# Patient Record
Sex: Female | Born: 1945 | Race: White | Hispanic: No | Smoking: Never smoker
Health system: Southern US, Community
[De-identification: ages and names within clinical notes are randomized; demographics above are authoritative.]

## PROBLEM LIST (undated history)

## (undated) DIAGNOSIS — I4891 Unspecified atrial fibrillation: Secondary | ICD-10-CM

## (undated) DIAGNOSIS — I10 Essential (primary) hypertension: Secondary | ICD-10-CM

---

## 1999-10-26 ENCOUNTER — Other Ambulatory Visit: Admission: RE | Admit: 1999-10-26 | Discharge: 1999-10-26 | Payer: Self-pay | Admitting: Obstetrics and Gynecology

## 1999-10-26 ENCOUNTER — Encounter (INDEPENDENT_AMBULATORY_CARE_PROVIDER_SITE_OTHER): Payer: Self-pay

## 2001-02-25 ENCOUNTER — Encounter: Admission: RE | Admit: 2001-02-25 | Discharge: 2001-02-25 | Payer: Self-pay | Admitting: Family Medicine

## 2001-02-25 ENCOUNTER — Encounter: Payer: Self-pay | Admitting: Family Medicine

## 2001-02-28 ENCOUNTER — Ambulatory Visit (HOSPITAL_COMMUNITY): Admission: AD | Admit: 2001-02-28 | Discharge: 2001-02-28 | Payer: Self-pay | Admitting: Urology

## 2001-03-26 ENCOUNTER — Encounter: Admission: RE | Admit: 2001-03-26 | Discharge: 2001-03-26 | Payer: Self-pay | Admitting: Urology

## 2001-03-26 ENCOUNTER — Encounter: Payer: Self-pay | Admitting: Urology

## 2006-01-09 ENCOUNTER — Encounter (INDEPENDENT_AMBULATORY_CARE_PROVIDER_SITE_OTHER): Payer: Self-pay | Admitting: Specialist

## 2006-01-09 ENCOUNTER — Ambulatory Visit (HOSPITAL_COMMUNITY): Admission: RE | Admit: 2006-01-09 | Discharge: 2006-01-09 | Payer: Self-pay | Admitting: Surgery

## 2006-11-25 ENCOUNTER — Observation Stay (HOSPITAL_COMMUNITY): Admission: EM | Admit: 2006-11-25 | Discharge: 2006-11-26 | Payer: Self-pay | Admitting: Emergency Medicine

## 2006-11-25 ENCOUNTER — Ambulatory Visit: Payer: Self-pay | Admitting: Cardiology

## 2008-04-10 ENCOUNTER — Emergency Department (HOSPITAL_COMMUNITY): Admission: EM | Admit: 2008-04-10 | Discharge: 2008-04-10 | Payer: Self-pay | Admitting: Emergency Medicine

## 2010-09-13 ENCOUNTER — Emergency Department (HOSPITAL_COMMUNITY): Admission: EM | Admit: 2010-09-13 | Discharge: 2010-09-14 | Payer: Self-pay | Admitting: Emergency Medicine

## 2010-09-17 ENCOUNTER — Ambulatory Visit: Payer: Self-pay | Admitting: Internal Medicine

## 2010-09-17 ENCOUNTER — Observation Stay (HOSPITAL_COMMUNITY): Admission: EM | Admit: 2010-09-17 | Discharge: 2010-09-18 | Payer: Self-pay | Admitting: Emergency Medicine

## 2010-09-18 ENCOUNTER — Encounter: Payer: Self-pay | Admitting: Infectious Diseases

## 2011-01-02 LAB — URINALYSIS, ROUTINE W REFLEX MICROSCOPIC
Hgb urine dipstick: NEGATIVE
Ketones, ur: NEGATIVE mg/dL
Protein, ur: NEGATIVE mg/dL
Urobilinogen, UA: 0.2 mg/dL (ref 0.0–1.0)

## 2011-01-02 LAB — BASIC METABOLIC PANEL
BUN: 14 mg/dL (ref 6–23)
BUN: 16 mg/dL (ref 6–23)
CO2: 28 mEq/L (ref 19–32)
Calcium: 8.8 mg/dL (ref 8.4–10.5)
Calcium: 9.1 mg/dL (ref 8.4–10.5)
Chloride: 103 mEq/L (ref 96–112)
Creatinine, Ser: 1.02 mg/dL (ref 0.4–1.2)
GFR calc Af Amer: >60 mL/min (ref >60)
GFR calc non Af Amer: 51 mL/min — ABNORMAL LOW (ref >60)
Glucose, Bld: 106 mg/dL — ABNORMAL HIGH (ref 70–99)
Glucose, Bld: 125 mg/dL — ABNORMAL HIGH (ref 70–99)

## 2011-01-02 LAB — TROPONIN I: Troponin I: 0.03 ng/mL (ref 0.00–0.06)

## 2011-01-02 LAB — CBC
HCT: 40.2 % (ref 36.0–46.0)
Hemoglobin: 13.5 g/dL (ref 12.0–15.0)
MCH: 29.9 pg (ref 26.0–34.0)
MCH: 29.9 pg (ref 26.0–34.0)
MCHC: 33.5 g/dL (ref 30.0–36.0)
MCV: 88.9 fL (ref 78.0–100.0)
Platelets: 230 10*3/uL (ref 150–400)
RBC: 4.52 MIL/uL (ref 3.87–5.11)
RDW: 13.3 % (ref 11.5–15.5)
WBC: 7.9 10*3/uL (ref 4.0–10.5)

## 2011-01-02 LAB — HEMOGLOBIN A1C
Hgb A1c MFr Bld: 5.6 % (ref <5.7)
Mean Plasma Glucose: 114 mg/dL (ref <117)

## 2011-01-02 LAB — LIPID PANEL
HDL: 47 mg/dL (ref >39)
Total CHOL/HDL Ratio: 3.5 RATIO
Triglycerides: 99 mg/dL (ref <150)

## 2011-01-02 LAB — BRAIN NATRIURETIC PEPTIDE: Pro B Natriuretic peptide (BNP): 266 pg/mL — ABNORMAL HIGH (ref 0.0–100.0)

## 2011-01-02 LAB — POCT CARDIAC MARKERS
Myoglobin, poc: 52.2 ng/mL (ref 12–200)
Myoglobin, poc: 63.4 ng/mL (ref 12–200)
Troponin i, poc: <0.05 ng/mL (ref 0.00–0.09)

## 2011-01-02 LAB — CARDIAC PANEL(CRET KIN+CKTOT+MB+TROPI)
Relative Index: INVALID (ref 0.0–2.5)
Troponin I: 0.04 ng/mL (ref 0.00–0.06)

## 2011-01-02 LAB — URINE MICROSCOPIC-ADD ON

## 2011-01-02 LAB — CK TOTAL AND CKMB (NOT AT ARMC): Relative Index: INVALID (ref 0.0–2.5)

## 2011-03-09 NOTE — H&P (Signed)
NAME:  Sara Hale, Sara Hale                  ACCOUNT NO.:  [id number]   MEDICAL RECORD NO.:  [id number]          PATIENT TYPE:  EMS   LOCATION:  MAJO                         FACILITY:  [facility]   PHYSICIAN:  Joellyn Rued, PA-C     DATE OF BIRTH:  January 25, 1946   DATE OF ADMISSION:  11/25/2006  DATE OF DISCHARGE:                              HISTORY & PHYSICAL   This is a continuation of the previous dictation.   PHYSICAL EXAM:  EXTREMITIES:  Negative cyanosis, clubbing, or edema.  MUSCULOSKELETAL:  Unremarkable.  NEURO:  Unremarkable.   Chest x-ray and labs are pending at the time of this dictation.  EKGs at  [facility] and in the hospital show normal sinus rhythm, non-specific  ST, T wave changes, delayed R waves, baseline artifact normal, intervals  inactive.   IMPRESSION:  1. Prolonged atypical chest discomfort, probably gastrointestinal in      etiology.  2. Hypertension; however, she has not had her prescriptions today.   HISTORY PER PAST MEDICAL HISTORY DISPOSITION:  Dr. Jens Som reviewed the  patient's history, spoke with and examined the patient and agrees with  the above.  We will admit her for observation and cycle enzymes to rule  out myocardial infarction.  If her enzymes are negative, anticipate  discharge without patient stress Myoview and probable GI evaluation.  We  will obtain our usual chest x-ray and labs as well as amylase and lipase  and a D-dimer.  Protonix will be added to her medical regimen.      Joellyn Rued, PA-C     EW/MEDQ  D:  11/25/2006  T:  11/26/2006  Job:  [id number]   cc:   Madolyn Frieze. Jens Som, MD, [facility]  [facility]

## 2011-03-09 NOTE — Discharge Summary (Signed)
NAME:  Sara Hale, Sara Hale                  ACCOUNT NO.:  [id number]   MEDICAL RECORD NO.:  [id number]          PATIENT TYPE:  INP   LOCATION:  3735                         FACILITY:  [facility]   PHYSICIAN:  Madolyn Frieze. Jens Som, MD, FACCDATE OF BIRTH:  1946-06-30   DATE OF ADMISSION:  11/25/2006  DATE OF DISCHARGE:  11/26/2006                               DISCHARGE SUMMARY   DISCHARGE DIAGNOSES:  1. Atypical epigastric/chest pain, negative cardiac enzymes, status      post PA and lateral chest x-ray showing no acute findings  2. Hypertension.  3. Gastroesophageal reflux disease.   HOSPITAL COURSE:  Sara Hale is a 65 year old female who presented to  [facility] emergency room after being referred here from [facility]  secondary to chest pain.  The patient transported by EMS.  The patient  complained of epigastric lower anterior chest burning, bloated feeling  under her right breast.  She tried TUMS without relief.  She also  complained of difficulty taking a deep breath.  Only medical history was  hypertension, kidney stones status post cholecystectomy.  She was seen  by Dr. Jens Som on admission.  EKG showed normal sinus rhythm, no ST  changes, rather atypical chest discomfort.  The patient admitted for  observation.  Ruled out for MI.  Patient being discharged home to follow  up with a stress Myoview which has been scheduled on a February 13 at  9:45.  The patient has been given the stress test guidelines.  She is to  follow up with primary care for one week appointment, follow up with Dr.  Jens Som as already scheduled, post stress test.   MEDICATIONS AT DISCHARGE:  1. Protonix 40 mg daily.  2. Lopressor 50.  3. HCTZ 25 mg daily.   These medications were as previously prescribed. Protonix is new.   PERTINENT LABORATORY DATA:  This admission, lipid panel:  Total  cholesterol 186, triglycerides 113, HDL 47, LDL 116, TSH 3.401,  hemoglobin A1c 5.7, lipase 28, amylase 62.  CBC:  WBC 7.1, H&H  14.1 and  41.0 with a platelet count 269,000.  Also, note the patient's D-dimer  was within normal limits.   DURATION OF DISCHARGE ENCOUNTER:  Twenty minutes.     Dorian Pod, ACNP      Madolyn Frieze. Jens Som, MD, [facility]  Electronically Signed   MB/MEDQ  D:  11/26/2006  T:  11/26/2006  Job:  [id number]

## 2011-03-09 NOTE — Op Note (Signed)
[facility]  Patient:    Sara Hale, Sara Hale [facility]                       MRN: [id number] Proc. Date: 02/28/01 Adm. Date:  [id number] Attending:  Laqueta Jean                           Operative Report  PREOPERATIVE DIAGNOSIS.  Left lower ureteral calculus.  POSTOPERATIVE DIAGNOSIS:  Left lower ureteral calculus.  OPERATION:  Cystourethroscopy, left retrograde pyelogram, balloon dilation of left ureteral orifice, left ureteroscopy, basket extraction of left ureteral stone.  SURGEON:  Sigmund I. Patsi Sears, M.D.  ANESTHESIA:  General (LMA).  PREPARATION:  After appropriate preanesthesia, the patient was brought to the operating room, placed on the operating table in the dorsal supine position where general LMA anesthesia was introduced.  REVIEW OF HISTORY:  The patient was seen 24 hours ago, with left ureteral calculus x 1 week with nonprogression, recurrent colic, and hematuria.  The patient works five days a week and would like to pursue having stone removed with as little work time lost as possible.  She is counseled with regard to basket extraction of stone or evaluation or watchful waiting and has elected to pursue extraction of stone.  DESCRIPTION OF PROCEDURE:  With the patient in the dorsal lithotomy position, the pubis was prepped with Betadine solution and draped in the usual fashion. Cystoscopy was accomplished and showed a very small left ureteral orifice. Retrograde pyelogram was performed and showed a stone in the left lower ureter, equal to the CT scan.  Ureteroscopy could not be performed because of the tightness of the ureteral orifice; therefore, a 4 cm balloon dilator was placed for three minutes and following this, a ureteroscopy was easily accomplished and stone identified and basket extracted.  Following this, repeat ureteroscopy was accomplished to the level of the renal pelvis, and no other stone could be identified.  Repeat  retrograde pyelogram was also performed, and interpretation showed no other stone within it.  It was elected not the leave the double-J catheter.  Xylocaine jelly was placed in the ureter, B&O suppository was given, and 30 mg of IV Toradol were given.  The patient was awakened and taken to the recovery room in good condition. DD:  02/28/01 TD:  03/01/01 Job: [id number]

## 2011-03-09 NOTE — H&P (Signed)
NAME:  Sara Hale, HARGAN                  ACCOUNT NO.:  [id number]   MEDICAL RECORD NO.:  [id number]          PATIENT TYPE:  INP   LOCATION:  3735                         FACILITY:  [facility]   PHYSICIAN:  Joellyn Rued, PA-C     DATE OF BIRTH:  1946/05/25   DATE OF ADMISSION:  11/25/2006  DATE OF DISCHARGE:                              HISTORY & PHYSICAL   PRIMARY CARE PHYSICIAN:  [employer] manager on [address].   CARDIOLOGIST:  Dr. Jens Som, new.   SUMMARY OF HISTORY:  Mrs. Cullifer is a 65 year old white female who is  referred from [facility] to [facility] for cardiac evaluation secondary  to chest discomfort.  Mrs. Ramaker states that since sometime Saturday she  has noticed an epigastric lower anterior chest burning and bloated  feeling  this radiates to under the right breast.  She denies any  associated nausea, vomiting, diaphoresis or shortness of breath.  However, she states that these feelings increase when she takes a deep  breath.  The discomfort has been constant since Saturday.  It has not  waxed and waned.  She gives it a 5-6 on a scale of 0 to 10.  She started  to try Rolaids to help he discomfort.  However, she took it out of her  mouth and never completed the treatment.  She describes similar  occurrences especially since her gallbladder surgery.  She feels that  her symptoms are similar prior to her gallbladder surgery.  However, she  states that the increased feeling of burning and bloating when she takes  in a deep breath is different.  In the past she has had similar symptoms  particularly after eating lunch at work.  She usually obtains relief  with TUMS.  She has not had any prior GI evaluation.  She states that in  the past she use to take stomach pills but they ran out a long time a  go.  She denies any recent injuries, falls, dietary changes.   She thought she would go to [facility] today due to the prolonged nature  of her discomfort and as is it was slightly different.   ALLERGIES:  1. CODEINE.  2. PENICILLIN.   MEDICATIONS PRIOR TO ADMISSION:  1. Lopressor 50 mg b.i.d.  2. Hydrochlorothiazide 25 mg daily.  Patient did not know the dosage, however.  These were supplied from  [facility] old medical records.   PAST MEDICAL HISTORY:  1. She has a history of hypertension for which she does not check at      home.  2. She was treated for kidney stones.  3. She underwent laparoscopic cholecystectomy March 2007.  4. She denies any prior cardiac evaluation.  5. She denies any history of known diabetes, myocardial infarction,      CVA, COPD, bleeding dyscrasias, thyroid disorder or hyperlipidemia.   SOCIAL HISTORY:  She resides in [place] with her husband.  She has 2  children, 7 grandchildren.  She is employed with the [employer] for the  [employer].  She denies any tobacco, alcohol, drugs, herbal medication use.  She does not  follow a specific diet.  She states that she walks quite a  bit at work maybe up to a 1/4 of a mile twice a day but is not on a  regular exercise program.   FAMILY HISTORY:  Her mother is deceased at the age of 62.  She is not  clear to the cause of it.  It may be related to food poisoning or rocky  mountain spotted fever.  Her father died in his 64s possibly secondary  to brain tumor with a history of tobacco and alcohol.  She has 2 brother  and 1 sister that are alive and well.  All smoke and both brothers drink  alcohol.   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   PHYSICAL EXAMINATION:  GENERAL:  Well-nourished, well-developed pleasant  white female in no apparent distress.  Temperature is 97.8.  blood   pressure 171/91.  Pulse 70.  Respirations 20.  100% SAT on 4 liters.  HEENT:  Is unremarkable.  NECK:  Supple without thyromegaly, adenopathy, JVD or carotid bruits.  CHEST:  Symmetrical excursion.  Clear to auscultation.  HEART:  PMI is nondisplaced.  Regular rate and rhythm.  I do not  appreciate any murmurs, rubs, clicks or gallops.  ABDOMEN:  Slightly round.  Bowel sounds present.  Without organomegaly  or masses.  She does have some slight epigastric tenderness that is  reproducible of her discomfort.  EXTREMITIES:  Negative cyanosis   Dictation ended at this point.      Joellyn Rued, PA-C     EW/MEDQ  D:  11/25/2006  T:  11/26/2006  Job:  [id number]

## 2011-03-09 NOTE — Op Note (Signed)
NAME:  Sara Hale, BINNING                  ACCOUNT NO.:  [id number]   MEDICAL RECORD NO.:  [id number]          PATIENT TYPE:  AMB   LOCATION:  DAY                          [facility]AC[facility]  [facility]   PHYSICIAN:  Wilmon Arms. Corliss Skains, M.D. DATE OF BIRTH:  April 08, 1946   DATE OF PROCEDURE:  01/09/2006  DATE OF DISCHARGE:                                 OPERATIVE REPORT   PREOPERATIVE DIAGNOSIS:  Chronic calculus cholecystitis.   POSTOPERATIVE DIAGNOSIS:  Chronic calculus cholecystitis.   PROCEDURE PERFORMED:  Laparoscopic cholecystectomy with intraoperative  cholangiogram.   SURGEON:  Wilmon Arms. Corliss Skains, M.D.   ASSISTANTSharlet Salina T. Hoxworth, M.D.   ANESTHESIA:  General endotracheal.   INDICATIONS:  The patient is a 59-year female who has had several weeks of  episodic right upper quadrant pain associated with nausea, bloating,  belching.  She was found to have a large 3.5 cm stone on ultrasound. There  was no evidence of gallbladder wall thickening. Her liver function tests  were also normal. The patient was referred for surgical evaluation and we  recommended laparoscopic cholecystectomy.   DESCRIPTION OF PROCEDURE:  The patient was brought to the operating room and  placed in supine position on the operating table. After an adequate level of  general endotracheal anesthesia was obtained, the patient's abdomen was  prepped with Betadine and draped in a sterile fashion. A time-out was taken  to ensure the proper patient and proper procedure. Her umbilicus was  infiltrated with 0.25% Marcaine. A small incision was made just below her  umbilicus. The fascia was opened vertically. The peritoneal cavity was  bluntly entered. A pursestring suture of zero Vicryl was placed around the  fascial opening. The Hassan cannula was inserted and secured with a stay  suture. Pneumoperitoneum was obtained by insufflating CO2 and maintaining a  maximal pressure of 15 mmHg. The patient was then positioned in reverse  Trendelenburg position, rotated slightly towards her left. Visual  examination of the abdomen showed no gross abnormalities. There were some  adhesions to the gallbladder. A 10 mm port was placed in the subxiphoid  position. Two 5 mm ports were placed in the right upper quadrant. The  gallbladder was grasped with a clamp and elevated over the edge of the  liver. There were some adhesions to the surface of the gallbladder as well  as to the liver. These adhesions were taken down with cautery. The cystic  duct was identified. The peritoneum was opened around the cystic duct and it  was circumferentially dissected and ligated with a clip distally. An opening  was created on the cystic duct and a Cook cholangiogram catheter was  inserted through a stab incision into the cystic duct. This was secured with  a clip. The cholangiogram was obtained which showed good flow proximally and  distally in a small biliary tree. No filling defects were noted. There was  good flow into the duodenum. The cholangiogram catheter was then removed.  The duct was ligated clips and divided. The cystic artery was then isolated,  ligated with clips and divided. A small  branch was also ligated with clips  and divided. Cautery was used to remove the gallbladder from the liver bed.  The liver bed was carefully inspected and cautery was used for hemostasis.  The gallbladder was then placed in an EndoCatch sac and removed through the  umbilicus. The fascial opening had to be enlarged slightly due to the large  size of the gallstone. The pursestring sutures used to tie down the fascial  opening. An additional zero Vicryl was used that to complete the  closure.  The right upper quadrant was reinspected. No bleeding was noted. The  irrigant was suctioned out. Pneumoperitoneum was released as the ports were  removed under direct vision. 4-0 Monocryl was used to close the skin in a  subcuticular fashion. The port sites had  previously been infiltrated with  0.25% Marcaine. Steri-Strips and clean dressings were applied. The patient  was then extubated and brought to the recovery room in stable condition. All  sponge, instrument and needle counts were correct.      Wilmon Arms. Tsuei, M.D.  Electronically Signed     MKT/MEDQ  D:  01/09/2006  T:  01/10/2006  Job:  [id number]

## 2011-07-19 LAB — URINE CULTURE

## 2011-07-19 LAB — DIFFERENTIAL
Basophils Relative: 0
Eosinophils Absolute: 0
Lymphs Abs: 1.3
Monocytes Relative: 6
Neutro Abs: 6.5
Neutrophils Relative %: 78 — ABNORMAL HIGH

## 2011-07-19 LAB — POCT I-STAT, CHEM 8
BUN: 17
Chloride: 111
Creatinine, Ser: 1.1
Sodium: 137
TCO2: 23

## 2011-07-19 LAB — URINALYSIS, ROUTINE W REFLEX MICROSCOPIC
Glucose, UA: NEGATIVE
Hgb urine dipstick: NEGATIVE
Protein, ur: NEGATIVE
pH: 6

## 2011-07-19 LAB — URINE MICROSCOPIC-ADD ON

## 2011-07-19 LAB — CBC
MCV: 87.3
Platelets: 210
RBC: 4.34
WBC: 8.3

## 2012-07-25 ENCOUNTER — Other Ambulatory Visit (HOSPITAL_COMMUNITY): Payer: Self-pay | Admitting: Urology

## 2012-07-25 DIAGNOSIS — N2889 Other specified disorders of kidney and ureter: Secondary | ICD-10-CM

## 2012-07-25 DIAGNOSIS — D3500 Benign neoplasm of unspecified adrenal gland: Secondary | ICD-10-CM

## 2013-09-04 ENCOUNTER — Emergency Department (HOSPITAL_COMMUNITY): Payer: Medicare Other

## 2013-09-04 ENCOUNTER — Inpatient Hospital Stay (HOSPITAL_COMMUNITY)
Admission: EM | Admit: 2013-09-04 | Discharge: 2013-09-08 | DRG: 247 | Disposition: A | Discharge status: Home / Self Care | Payer: Medicare Other | Attending: Cardiovascular Disease | Admitting: Cardiovascular Disease

## 2013-09-04 ENCOUNTER — Encounter (HOSPITAL_COMMUNITY): Payer: Self-pay | Admitting: Emergency Medicine

## 2013-09-04 DIAGNOSIS — F411 Generalized anxiety disorder: Secondary | ICD-10-CM | POA: Diagnosis present

## 2013-09-04 DIAGNOSIS — I4891 Unspecified atrial fibrillation: Secondary | ICD-10-CM | POA: Insufficient documentation

## 2013-09-04 DIAGNOSIS — I214 Non-ST elevation (NSTEMI) myocardial infarction: Principal | ICD-10-CM | POA: Diagnosis present

## 2013-09-04 DIAGNOSIS — I129 Hypertensive chronic kidney disease with stage 1 through stage 4 chronic kidney disease, or unspecified chronic kidney disease: Secondary | ICD-10-CM | POA: Diagnosis present

## 2013-09-04 DIAGNOSIS — R110 Nausea: Secondary | ICD-10-CM | POA: Diagnosis present

## 2013-09-04 DIAGNOSIS — E7800 Pure hypercholesterolemia, unspecified: Secondary | ICD-10-CM | POA: Diagnosis present

## 2013-09-04 DIAGNOSIS — I079 Rheumatic tricuspid valve disease, unspecified: Secondary | ICD-10-CM | POA: Diagnosis present

## 2013-09-04 DIAGNOSIS — N189 Chronic kidney disease, unspecified: Secondary | ICD-10-CM | POA: Diagnosis present

## 2013-09-04 HISTORY — DX: Essential (primary) hypertension: I10

## 2013-09-04 HISTORY — DX: Unspecified atrial fibrillation: I48.91

## 2013-09-04 LAB — POCT I-STAT, CHEM 8
BUN: 23 mg/dL (ref 6–23)
Calcium, Ion: 1.18 mmol/L (ref 1.13–1.30)
Calcium, Ion: 1.21 mmol/L (ref 1.13–1.30)
Chloride: 107 mEq/L (ref 96–112)
Creatinine, Ser: 1.2 mg/dL — ABNORMAL HIGH (ref 0.50–1.10)
Creatinine, Ser: 1.2 mg/dL — ABNORMAL HIGH (ref 0.50–1.10)
Glucose, Bld: 124 mg/dL — ABNORMAL HIGH (ref 70–99)
HCT: 41 % (ref 36.0–46.0)
Hemoglobin: 13.9 g/dL (ref 12.0–15.0)
TCO2: 25 mmol/L (ref 0–100)

## 2013-09-04 LAB — CBC WITH DIFFERENTIAL/PLATELET
Basophils Relative: 1 % (ref 0–1)
Eosinophils Absolute: 0.1 10*3/uL (ref 0.0–0.7)
Eosinophils Relative: 2 % (ref 0–5)
HCT: 40.7 % (ref 36.0–46.0)
Hemoglobin: 14.2 g/dL (ref 12.0–15.0)
Lymphs Abs: 1.2 10*3/uL (ref 0.7–4.0)
MCH: 32.3 pg (ref 26.0–34.0)
MCHC: 34.9 g/dL (ref 30.0–36.0)
MCV: 92.7 fL (ref 78.0–100.0)
Monocytes Absolute: 0.4 10*3/uL (ref 0.1–1.0)
Monocytes Relative: 5 % (ref 3–12)

## 2013-09-04 MED ORDER — HYDROCHLOROTHIAZIDE 12.5 MG PO CAPS
12.5000 mg | ORAL_CAPSULE | Freq: Every day | ORAL | Status: DC
  Filled 2013-09-04: qty 1

## 2013-09-04 MED ORDER — ACETAMINOPHEN 325 MG PO TABS
650.0000 mg | ORAL_TABLET | ORAL | Status: DC | PRN
  Filled 2013-09-04: qty 2

## 2013-09-04 MED ORDER — ASPIRIN 300 MG RE SUPP
300.0000 mg | RECTAL | Status: AC
  Filled 2013-09-04: qty 1

## 2013-09-04 MED ORDER — MORPHINE SULFATE 2 MG/ML IJ SOLN
2.0000 mg | Freq: Once | INTRAMUSCULAR | Status: AC
  Administered 2013-09-04: 2 mg via INTRAVENOUS
  Filled 2013-09-04: qty 1

## 2013-09-04 MED ORDER — HEPARIN (PORCINE) IN NACL 100-0.45 UNIT/ML-% IJ SOLN
700.0000 [IU]/h | INTRAMUSCULAR | Status: DC
  Administered 2013-09-04: 800 [IU]/h via INTRAVENOUS
  Filled 2013-09-04: qty 250

## 2013-09-04 MED ORDER — HEPARIN BOLUS VIA INFUSION
4000.0000 [IU] | Freq: Once | INTRAVENOUS | Status: AC
  Administered 2013-09-04: 4000 [IU] via INTRAVENOUS
  Filled 2013-09-04: qty 4000

## 2013-09-04 MED ORDER — CARVEDILOL 3.125 MG PO TABS
3.1250 mg | ORAL_TABLET | Freq: Two times a day (BID) | ORAL | Status: DC
  Administered 2013-09-04 – 2013-09-06 (×3): 3.125 mg via ORAL
  Filled 2013-09-04 (×6): qty 1

## 2013-09-04 MED ORDER — ALPRAZOLAM 0.25 MG PO TABS: 0.2500 mg | ORAL_TABLET | Freq: Two times a day (BID) | ORAL | Status: DC | PRN

## 2013-09-04 MED ORDER — LISINOPRIL 10 MG PO TABS
10.0000 mg | ORAL_TABLET | Freq: Every day | ORAL | Status: DC
  Administered 2013-09-04 – 2013-09-08 (×4): 10 mg via ORAL
  Filled 2013-09-04 (×5): qty 1

## 2013-09-04 MED ORDER — NITROGLYCERIN IN D5W 200-5 MCG/ML-% IV SOLN
2.0000 ug/min | INTRAVENOUS | Status: DC
  Administered 2013-09-04: 10 ug/min via INTRAVENOUS
  Filled 2013-09-04: qty 250

## 2013-09-04 MED ORDER — ONDANSETRON HCL 4 MG/2ML IJ SOLN
4.0000 mg | Freq: Once | INTRAMUSCULAR | Status: AC
  Administered 2013-09-04: 4 mg via INTRAVENOUS
  Filled 2013-09-04: qty 2

## 2013-09-04 MED ORDER — ONDANSETRON HCL 4 MG/2ML IJ SOLN
4.0000 mg | Freq: Four times a day (QID) | INTRAMUSCULAR | Status: DC | PRN
  Administered 2013-09-05: 4 mg via INTRAVENOUS
  Filled 2013-09-04: qty 2

## 2013-09-04 MED ORDER — SODIUM CHLORIDE 0.9 % IJ SOLN: 3.0000 mL | INTRAMUSCULAR | Status: DC | PRN

## 2013-09-04 MED ORDER — NITROGLYCERIN 0.4 MG SL SUBL: 0.4000 mg | SUBLINGUAL_TABLET | SUBLINGUAL | Status: DC | PRN

## 2013-09-04 MED ORDER — HYDROCHLOROTHIAZIDE 25 MG PO TABS
12.5000 mg | ORAL_TABLET | Freq: Every day | ORAL | Status: DC
  Filled 2013-09-04: qty 1

## 2013-09-04 MED ORDER — SODIUM CHLORIDE 0.9 % IJ SOLN
3.0000 mL | Freq: Two times a day (BID) | INTRAMUSCULAR | Status: DC
  Administered 2013-09-05 – 2013-09-07 (×6): 3 mL via INTRAVENOUS

## 2013-09-04 MED ORDER — SODIUM CHLORIDE 0.9 % IV SOLN: 250.0000 mL | INTRAVENOUS | Status: DC | PRN

## 2013-09-04 MED ORDER — ASPIRIN 81 MG PO CHEW
324.0000 mg | CHEWABLE_TABLET | ORAL | Status: AC
  Filled 2013-09-04: qty 4

## 2013-09-04 MED ORDER — GI COCKTAIL ~~LOC~~
30.0000 mL | Freq: Once | ORAL | Status: AC
  Administered 2013-09-04: 30 mL via ORAL
  Filled 2013-09-04: qty 30

## 2013-09-04 MED ORDER — ALPRAZOLAM 0.25 MG PO TABS
0.2500 mg | ORAL_TABLET | Freq: Three times a day (TID) | ORAL | Status: DC | PRN
  Administered 2013-09-04 – 2013-09-06 (×3): 0.25 mg via ORAL
  Filled 2013-09-04 (×3): qty 1

## 2013-09-04 MED ORDER — ASPIRIN EC 81 MG PO TBEC
81.0000 mg | DELAYED_RELEASE_TABLET | Freq: Every day | ORAL | Status: DC
  Administered 2013-09-05: 81 mg via ORAL
  Filled 2013-09-04: qty 1

## 2013-09-04 MED ORDER — SODIUM CHLORIDE 0.9 % IV SOLN
INTRAVENOUS | Status: DC
  Administered 2013-09-04: 21:00:00 via INTRAVENOUS

## 2013-09-04 NOTE — ED Notes (Signed)
Dr Kadakia at bedside 

## 2013-09-04 NOTE — ED Notes (Signed)
Reports prior to onset CP "not feeling right & not myself". States midsternal chest achiness started which went up into neck, jaw & BUE. Denied SOB, diaphoresis, n/v. States she did feel a little dizzy but denies now. Reports no change in chest achiness after NTG x3  Pt NAD. Resp e/u, no distress.

## 2013-09-04 NOTE — Progress Notes (Signed)
ANTICOAGULATION CONSULT NOTE - Initial Consult  Pharmacy Consult for Heparin Indication: chest pain/ACS  Allergies  Allergen Reactions  . Codeine     Patient Measurements: Height: 5\' 6"  (167.6 cm) Weight: 130 lb (58.968 kg) IBW/kg (Calculated) : 59.3 Heparin Dosing Weight:  59 kg  Vital Signs: Temp: 97.7 F (36.5 C) (11/14 1705) Temp src: Oral (11/14 1705) BP: 136/88 mmHg (11/14 1705) Pulse Rate: 84 (11/14 1705)  Labs:  Recent Labs  09/04/13 1727 09/04/13 1747 09/04/13 1831  HGB 14.2 13.9 12.9  HCT 40.7 41.0 38.0  PLT 229  --   --   CREATININE  --  1.20* 1.20*    Estimated Creatinine Clearance: 41.2 ml/min (by C-G formula based on Cr of 1.2).   Medical History: Past Medical History  Diagnosis Date  . Hypertension   . Atrial fibrillation     Medications: pending   Assessment: 67 y/o with sudden onset of CP radiating to jaw and shoulders. PMH includes HTN and afib. Does not currently take any anticoagulants for Afib except ASA 325mg .  Noted labs: Scr 1.2. Troponin i=0.22 elevated, CBC WNL  Goal of Therapy:  Heparin level 0.3-0.7 units/ml Monitor platelets by anticoagulation protocol: Yes   Plan:   Heparin 4000 unit IV bolus and infusion 800 units/hr Check heparin level in 6-8 hrs and daily.   Sara Hale, PharmD, BCPS Clinical Staff Pharmacist Pager [phone]  Sara Hale 09/04/2013,6:42 PM

## 2013-09-04 NOTE — ED Notes (Signed)
Pt undressed, in gown, on monitor, continuous pulse oximetry and blood pressure cuff; blanket given 

## 2013-09-04 NOTE — ED Provider Notes (Signed)
CSN: [id number]     Arrival date & time 09/04/13  1655 History   First MD Initiated Contact with Patient 09/04/13 1704     Chief Complaint  Patient presents with  . Chest Pain    HPI While bringing groceries into house sudden onset midsternal CP radiating into jaw & both shoulders. Denies SOB, n/v, diaphoresis. Per EMS pt took ASA 325mg  & given NTG x3 with no relief.   Past Medical History  Diagnosis Date  . Hypertension   . Atrial fibrillation    History reviewed. No pertinent past surgical history. No family history on file. History  Substance Use Topics  . Smoking status: Never Smoker   . Smokeless tobacco: Not on file  . Alcohol Use: No   OB History   Grav Para Term Preterm Abortions TAB SAB Ect Mult Living                 Review of Systems  All other systems reviewed and are negative.    Allergies  Codeine  Home Medications   Current Outpatient Rx  Name  Route  Sig  Dispense  Refill  . aspirin 325 MG tablet   Oral   Take 325 mg by mouth daily.         . calcium-vitamin D (OSCAL WITH D) 500-200 MG-UNIT per tablet   Oral   Take 1 tablet by mouth daily.         . carvedilol (COREG) 3.125 MG tablet   Oral   Take 3.125 mg by mouth 2 (two) times daily with a meal.         . hydrochlorothiazide (HYDRODIURIL) 12.5 MG tablet   Oral   Take 12.5 mg by mouth daily.         Marland Kitchen lisinopril (PRINIVIL,ZESTRIL) 20 MG tablet   Oral   Take 10 mg by mouth daily.         . Multiple Vitamins-Minerals (MULTIVITAMIN WITH MINERALS) tablet   Oral   Take 1 tablet by mouth daily.          BP 151/95  Pulse 53  Temp(Src) 97.7 F (36.5 C) (Oral)  Resp 21  Ht 5\' 6"  (1.676 m)  Wt 130 lb (58.968 kg)  BMI 20.99 kg/m2  SpO2 93% Physical Exam  Nursing note and vitals reviewed. Constitutional: She is oriented to person, place, and time. She appears well-developed and well-nourished. No distress.  HENT:  Head: Normocephalic and atraumatic.  Eyes: Pupils are  equal, round, and reactive to light.  Neck: Normal range of motion.  Cardiovascular: Normal rate and intact distal pulses.   Pulmonary/Chest: No respiratory distress.  Abdominal: Normal appearance. She exhibits no distension.  Musculoskeletal: Normal range of motion.  Neurological: She is alert and oriented to person, place, and time. No cranial nerve deficit.  Skin: Skin is warm and dry. No rash noted.  Psychiatric: She has a normal mood and affect. Her behavior is normal.    ED Course  Procedures (including critical care time) Labs Review Labs Reviewed  CBC WITH DIFFERENTIAL - Abnormal; Notable for the following:    Neutrophils Relative % 79 (*)    All other components within normal limits  POCT I-STAT, CHEM 8 - Abnormal; Notable for the following:    Creatinine, Ser 1.20 (*)    Glucose, Bld 124 (*)    All other components within normal limits  POCT I-STAT TROPONIN I - Abnormal; Notable for the following:    Troponin i, poc  0.22 (*)    All other components within normal limits  POCT I-STAT, CHEM 8 - Abnormal; Notable for the following:    Creatinine, Ser 1.20 (*)    Glucose, Bld 117 (*)    All other components within normal limits  HEPARIN LEVEL (UNFRACTIONATED)  HEPARIN LEVEL (UNFRACTIONATED)  CBC   Medications  heparin ADULT infusion 100 units/mL (25000 units/250 mL) (800 Units/hr Intravenous New Bag/Given 09/04/13 1923)  carvedilol (COREG) tablet 3.125 mg (not administered)  hydrochlorothiazide (HYDRODIURIL) tablet 12.5 mg (not administered)  lisinopril (PRINIVIL,ZESTRIL) tablet 10 mg (not administered)  heparin bolus via infusion 4,000 Units (4,000 Units Intravenous New Bag/Given 09/04/13 1923)  morphine 2 MG/ML injection 2 mg (2 mg Intravenous Given 09/04/13 1920)  ondansetron (ZOFRAN) injection 4 mg (4 mg Intravenous Given 09/04/13 1949)    Imaging Review Dg Chest Portable 1 View  09/04/2013   CLINICAL DATA:  Chest pain and pressure.  EXAM: PORTABLE CHEST - 1  VIEW  COMPARISON:  02/23/2012.  FINDINGS: Trachea is midline. Heart size stable. Minimal biapical pleural thickening. There may be scarring or prominent epicardial fat along the cardiac apex, unchanged from 09/17/2010. Lungs are otherwise clear. No pleural fluid.  IMPRESSION: No acute findings.   Electronically Signed   By: Leanna Battles M.D.   On: 09/04/2013 17:49   CRITICAL CARE Performed by: Nelva Nay L Total critical care time: 30 min Critical care time was exclusive of separately billable procedures and treating other patients. Critical care was necessary to treat or prevent imminent or life-threatening deterioration. Critical care was time spent personally by me on the following activities: development of treatment plan with patient and/or surrogate as well as nursing, discussions with consultants, evaluation of patient's response to treatment, examination of patient, obtaining history from patient or surrogate, ordering and performing treatments and interventions, ordering and review of laboratory studies, ordering and review of radiographic studies, pulse oximetry and re-evaluation of patient's condition.  EKG Interpretation     Ventricular Rate:  95 PR Interval:    QRS Duration: 96 QT Interval:  403 QTC Calculation: 507 R Axis:   97 Text Interpretation:  Atrial fibrillation (new) Borderline right axis deviation Low voltage, extremity and precordial leads Borderline ST depression, anterior leads Borderline prolonged QT interval            MDM   1. NSTEMI (non-ST elevated myocardial infarction)        Nelia Shi, MD 09/04/13 2011

## 2013-09-04 NOTE — H&P (Signed)
Sara Hale is an 67 y.o. female.   Chief Complaint: Chest pain HPI: 67 year old female with 1 year history of atrial fibrillation without anticoagulation due to cost of the medication has substernal pressure type chest pain radiating to jaw and both shoulder. No shortness of breath, nausea or diaphoresis. No relief with NTG x 3. Mild nausea with morphine use.   Past Medical History  Diagnosis Date  . Hypertension   . Atrial fibrillation       History reviewed. No pertinent past surgical history.  No family history on file. Social History:  reports that she has never smoked. She does not have any smokeless tobacco history on file. She reports that she does not drink alcohol. Her drug history is not on file.  Allergies:  Allergies  Allergen Reactions  . Codeine      (Not in a hospital admission)  Results for orders placed during the hospital encounter of 09/04/13 (from the past 48 hour(s))  CBC WITH DIFFERENTIAL     Status: Abnormal   Collection Time    09/04/13  5:27 PM      Result Value Range   WBC 8.5  4.0 - 10.5 K/uL   RBC 4.39  3.87 - 5.11 MIL/uL   Hemoglobin 14.2  12.0 - 15.0 g/dL   HCT 96.0  45.4 - 09.8 %   MCV 92.7  78.0 - 100.0 fL   MCH 32.3  26.0 - 34.0 pg   MCHC 34.9  30.0 - 36.0 g/dL   RDW 11.9  14.7 - 82.9 %   Platelets 229  150 - 400 K/uL   Neutrophils Relative % 79 (*) 43 - 77 %   Neutro Abs 6.7  1.7 - 7.7 K/uL   Lymphocytes Relative 14  12 - 46 %   Lymphs Abs 1.2  0.7 - 4.0 K/uL   Monocytes Relative 5  3 - 12 %   Monocytes Absolute 0.4  0.1 - 1.0 K/uL   Eosinophils Relative 2  0 - 5 %   Eosinophils Absolute 0.1  0.0 - 0.7 K/uL   Basophils Relative 1  0 - 1 %   Basophils Absolute 0.1  0.0 - 0.1 K/uL  POCT I-STAT, CHEM 8     Status: Abnormal   Collection Time    09/04/13  5:47 PM      Result Value Range   Sodium 143  135 - 145 mEq/L   Potassium 4.4  3.5 - 5.1 mEq/L   Chloride 106  96 - 112 mEq/L   BUN 23  6 - 23 mg/dL   Creatinine, Ser 5.62 (*)  0.50 - 1.10 mg/dL   Glucose, Bld 130 (*) 70 - 99 mg/dL   Calcium, Ion 8.65  7.84 - 1.30 mmol/L   TCO2 25  0 - 100 mmol/L   Hemoglobin 13.9  12.0 - 15.0 g/dL   HCT 69.6  29.5 - 28.4 %  POCT I-STAT TROPONIN I     Status: Abnormal   Collection Time    09/04/13  6:19 PM      Result Value Range   Troponin i, poc 0.22 (*) 0.00 - 0.08 ng/mL   Comment NOTIFIED PHYSICIAN     Comment 3            Comment: Due to the release kinetics of cTnI,     a negative result within the first hours     of the onset of symptoms does not rule out  myocardial infarction with certainty.     If myocardial infarction is still suspected,     repeat the test at appropriate intervals.  POCT I-STAT, CHEM 8     Status: Abnormal   Collection Time    09/04/13  6:31 PM      Result Value Range   Sodium 143  135 - 145 mEq/L   Potassium 4.3  3.5 - 5.1 mEq/L   Chloride 107  96 - 112 mEq/L   BUN 23  6 - 23 mg/dL   Creatinine, Ser 1.61 (*) 0.50 - 1.10 mg/dL   Glucose, Bld 096 (*) 70 - 99 mg/dL   Calcium, Ion 0.45  4.09 - 1.30 mmol/L   TCO2 25  0 - 100 mmol/L   Hemoglobin 12.9  12.0 - 15.0 g/dL   HCT 81.1  91.4 - 78.2 %   Dg Chest Portable 1 View  09/04/2013   CLINICAL DATA:  Chest pain and pressure.  EXAM: PORTABLE CHEST - 1 VIEW  COMPARISON:  02/23/2012.  FINDINGS: Trachea is midline. Heart size stable. Minimal biapical pleural thickening. There may be scarring or prominent epicardial fat along the cardiac apex, unchanged from 09/17/2010. Lungs are otherwise clear. No pleural fluid.  IMPRESSION: No acute findings.   Electronically Signed   By: Leanna Battles M.D.   On: 09/04/2013 17:49    ROS No wight gain or loss, + reading glasses use, + Partial plate use, Depression (from losing husband in 03/2013), + kidney stone, No stroke, + chest pain, + hypertension, + shortness of breath, + palpitations, No stroke, seizures, no hepatitis, + hemorrhoids. + GERD.  Blood pressure 152/102, pulse 84, temperature 97.7 F (36.5  C), temperature source Oral, resp. rate 20, height 5\' 6"  (1.676 m), weight 58.968 kg (130 lb), SpO2 98.00%.  PHYSICAL EXAMINATION: GENERAL: Well-nourished, well-developed pleasant  white female in mild distress.   HEENT: Normocephalic, atruamatic, Blue eyes, Conjunctivae-pink, Sclera- white.  NECK: Supple without thyromegaly, adenopathy, JVD.  CHEST: Symmetrical excursion. Clear to auscultation.  HEART: Irregular rate and rhythm. S1, S2 normal. Grade II/VI systolic murmur, no rubs, clicks or gallops.  ABDOMEN: Non distended and non-tender. Bowel sounds present.   EXTREMITIES: Negative cyanosis, edema or clubbing. CNS: Moves all 4 extremities.    Assessment/Plan Chest pain r/o MI Hypertension Atrial fibrillation, chronic CKD, II Anxiety  Admit/ Cycle cardiac enzymes, start heparin, Home medications Offer cardiac cath on Monday is agreeable.  Tori Cupps S 09/04/2013, 7:45 PM

## 2013-09-04 NOTE — ED Notes (Signed)
While bringing groceries into house sudden onset midsternal CP radiating into jaw & both shoulders. Denies SOB, n/v, diaphoresis. Per EMS pt took ASA 325mg  & given NTG x3 with no relief.

## 2013-09-05 ENCOUNTER — Ambulatory Visit (HOSPITAL_COMMUNITY): Admit: 2013-09-05 | Payer: Self-pay | Admitting: Cardiology

## 2013-09-05 ENCOUNTER — Other Ambulatory Visit: Payer: Self-pay

## 2013-09-05 ENCOUNTER — Encounter (HOSPITAL_COMMUNITY)
Admission: EM | Disposition: A | Discharge status: Home / Self Care | Payer: Medicare HMO | Source: Home / Self Care | Attending: Cardiovascular Disease

## 2013-09-05 ENCOUNTER — Encounter (HOSPITAL_COMMUNITY): Payer: Self-pay | Admitting: *Deleted

## 2013-09-05 HISTORY — PX: LEFT HEART CATHETERIZATION WITH CORONARY ANGIOGRAM: SHX5451

## 2013-09-05 LAB — LIPID PANEL
Cholesterol: 145 mg/dL (ref 0–200)
VLDL: 17 mg/dL (ref 0–40)

## 2013-09-05 LAB — PROTIME-INR
INR: 1.12 (ref 0.00–1.49)
Prothrombin Time: 14.2 seconds (ref 11.6–15.2)

## 2013-09-05 LAB — BASIC METABOLIC PANEL
BUN: 21 mg/dL (ref 6–23)
CO2: 19 mEq/L (ref 19–32)
Chloride: 105 mEq/L (ref 96–112)
GFR calc Af Amer: 80 mL/min — ABNORMAL LOW (ref >90)
Glucose, Bld: 115 mg/dL — ABNORMAL HIGH (ref 70–99)
Potassium: 4.9 mEq/L (ref 3.5–5.1)
Sodium: 139 mEq/L (ref 135–145)

## 2013-09-05 LAB — CBC
HCT: 37 % (ref 36.0–46.0)
MCH: 32 pg (ref 26.0–34.0)
MCV: 91.1 fL (ref 78.0–100.0)
Platelets: 194 10*3/uL (ref 150–400)
RBC: 4.06 MIL/uL (ref 3.87–5.11)
WBC: 10.9 10*3/uL — ABNORMAL HIGH (ref 4.0–10.5)

## 2013-09-05 LAB — TROPONIN I
Troponin I: 17.82 ng/mL (ref <0.30)
Troponin I: 15.24 ng/mL (ref <0.30)
Troponin I: >20 ng/mL (ref <0.30)

## 2013-09-05 LAB — HEPARIN LEVEL (UNFRACTIONATED): Heparin Unfractionated: 0.99 IU/mL — ABNORMAL HIGH (ref 0.30–0.70)

## 2013-09-05 SURGERY — LEFT HEART CATHETERIZATION WITH CORONARY ANGIOGRAM
Anesthesia: LOCAL

## 2013-09-05 MED ORDER — CLOPIDOGREL BISULFATE 300 MG PO TABS
ORAL_TABLET | ORAL | Status: AC
  Filled 2013-09-05: qty 1

## 2013-09-05 MED ORDER — BOOST / RESOURCE BREEZE PO LIQD
1.0000 | Freq: Two times a day (BID) | ORAL | Status: DC
  Administered 2013-09-06 – 2013-09-08 (×3): 1 via ORAL

## 2013-09-05 MED ORDER — ACETAMINOPHEN 325 MG PO TABS: 650.0000 mg | ORAL_TABLET | ORAL | Status: DC | PRN

## 2013-09-05 MED ORDER — CLOPIDOGREL BISULFATE 75 MG PO TABS
75.0000 mg | ORAL_TABLET | Freq: Every day | ORAL | Status: DC
  Administered 2013-09-05: 75 mg via ORAL
  Filled 2013-09-05 (×2): qty 1

## 2013-09-05 MED ORDER — ASPIRIN 81 MG PO CHEW: 81.0000 mg | CHEWABLE_TABLET | ORAL | Status: AC

## 2013-09-05 MED ORDER — SODIUM CHLORIDE 0.9 % IJ SOLN: 3.0000 mL | INTRAMUSCULAR | Status: DC | PRN

## 2013-09-05 MED ORDER — SODIUM CHLORIDE 0.9 % IV SOLN
INTRAVENOUS | Status: DC
  Administered 2013-09-05 – 2013-09-06 (×2): via INTRAVENOUS

## 2013-09-05 MED ORDER — BIVALIRUDIN 250 MG IV SOLR
INTRAVENOUS | Status: AC
  Filled 2013-09-05: qty 250

## 2013-09-05 MED ORDER — ATORVASTATIN CALCIUM 80 MG PO TABS
80.0000 mg | ORAL_TABLET | Freq: Every day | ORAL | Status: DC
  Administered 2013-09-05 – 2013-09-07 (×3): 80 mg via ORAL
  Filled 2013-09-05 (×4): qty 1

## 2013-09-05 MED ORDER — FAMOTIDINE IN NACL 20-0.9 MG/50ML-% IV SOLN
INTRAVENOUS | Status: AC
  Filled 2013-09-05: qty 50

## 2013-09-05 MED ORDER — SODIUM CHLORIDE 0.9 % IJ SOLN: 3.0000 mL | Freq: Two times a day (BID) | INTRAMUSCULAR | Status: DC

## 2013-09-05 MED ORDER — MIDAZOLAM HCL 2 MG/2ML IJ SOLN
INTRAMUSCULAR | Status: AC
  Filled 2013-09-05: qty 2

## 2013-09-05 MED ORDER — CLOPIDOGREL BISULFATE 300 MG PO TABS
300.0000 mg | ORAL_TABLET | Freq: Once | ORAL | Status: AC
  Administered 2013-09-05: 300 mg via ORAL
  Filled 2013-09-05: qty 1

## 2013-09-05 MED ORDER — DIAZEPAM 5 MG PO TABS
5.0000 mg | ORAL_TABLET | ORAL | Status: AC
  Administered 2013-09-05: 5 mg via ORAL
  Filled 2013-09-05: qty 1

## 2013-09-05 MED ORDER — ONDANSETRON HCL 4 MG/2ML IJ SOLN
4.0000 mg | Freq: Four times a day (QID) | INTRAMUSCULAR | Status: DC | PRN
  Administered 2013-09-05: 4 mg via INTRAVENOUS
  Filled 2013-09-05: qty 2

## 2013-09-05 MED ORDER — SODIUM CHLORIDE 0.9 % IV SOLN: 250.0000 mL | INTRAVENOUS | Status: DC | PRN

## 2013-09-05 MED ORDER — NITROGLYCERIN IN D5W 200-5 MCG/ML-% IV SOLN: 5.0000 ug/min | INTRAVENOUS | Status: DC

## 2013-09-05 MED ORDER — HEPARIN (PORCINE) IN NACL 100-0.45 UNIT/ML-% IJ SOLN
600.0000 [IU]/h | INTRAMUSCULAR | Status: DC
  Administered 2013-09-05: 600 [IU]/h via INTRAVENOUS
  Filled 2013-09-05 (×2): qty 250

## 2013-09-05 MED ORDER — HEPARIN (PORCINE) IN NACL 100-0.45 UNIT/ML-% IJ SOLN: 600.0000 [IU]/h | INTRAMUSCULAR | Status: DC

## 2013-09-05 MED ORDER — HEPARIN (PORCINE) IN NACL 2-0.9 UNIT/ML-% IJ SOLN
INTRAMUSCULAR | Status: AC
  Filled 2013-09-05: qty 1000

## 2013-09-05 MED ORDER — FENTANYL CITRATE 0.05 MG/ML IJ SOLN
INTRAMUSCULAR | Status: AC
  Filled 2013-09-05: qty 2

## 2013-09-05 MED ORDER — NITROGLYCERIN 0.2 MG/ML ON CALL CATH LAB
INTRAVENOUS | Status: AC
  Filled 2013-09-05: qty 1

## 2013-09-05 MED ORDER — LIDOCAINE HCL (PF) 1 % IJ SOLN
INTRAMUSCULAR | Status: AC
  Filled 2013-09-05: qty 30

## 2013-09-05 MED ORDER — ASPIRIN 81 MG PO CHEW
81.0000 mg | CHEWABLE_TABLET | Freq: Every day | ORAL | Status: DC
  Administered 2013-09-06 – 2013-09-08 (×3): 81 mg via ORAL
  Filled 2013-09-05 (×3): qty 1

## 2013-09-05 MED ORDER — CLOPIDOGREL BISULFATE 75 MG PO TABS
75.0000 mg | ORAL_TABLET | Freq: Every day | ORAL | Status: DC
  Administered 2013-09-06 – 2013-09-08 (×3): 75 mg via ORAL
  Filled 2013-09-05 (×4): qty 1

## 2013-09-05 MED ORDER — SODIUM CHLORIDE 0.9 % IV SOLN: INTRAVENOUS | Status: DC

## 2013-09-05 NOTE — CV Procedure (Signed)
Left cardiac cath./PTCA stenting report dictated on 09/05/2013 dictation number is [id number]

## 2013-09-05 NOTE — Progress Notes (Signed)
Subjective:  Patient complained of recurrent chest pain earlier today. Noted to have significantly elevated troponin I. EKG showed atrial fibrillation with controlled ventricular response with minor ST-T wave changes   Obj the  mildly speak with her she last 24 hours: Temp:  [97.7 F (36.5 C)-98 F (36.7 C)] 97.7 F (36.5 C) (11/15 0800) Pulse Rate:  [53-106] 78 (11/15 0800) Resp:  [15-22] 15 (11/15 0800) BP: (105-163)/(65-114) 114/65 mmHg (11/15 0800) SpO2:  [92 %-98 %] 96 % (11/15 0800) Weight:  [58.968 kg (130 lb)-66.8 kg (147 lb 4.3 oz)] 66.6 kg (146 lb 13.2 oz) (11/15 0647)  Intake/Output from previous day: 11/14 0701 - 11/15 0700 In: 490 [P.O.:240; I.V.:250] Out: 300 [Urine:300] Intake/Output from this shift: Total I/O In: -  Out: 250 [Urine:250]  Physical Exam: Neck: no adenopathy, no carotid bruit, no JVD and supple, symmetrical, trachea midline Lungs: clear to auscultation bilaterally Heart: irregularly irregular rhythm, S1, S2 normal and Soft systolic murmur noted no S3 gallop Abdomen: soft, non-tender; bowel sounds normal; no masses,  no organomegaly Extremities: extremities normal, atraumatic, no cyanosis or edema  Lab Results:  Recent Labs  09/04/13 1727 09/04/13 1747 09/04/13 1831  WBC 8.5  --   --   HGB 14.2 13.9 12.9  PLT 229  --   --     Recent Labs  09/04/13 1831 09/05/13 0442  NA 143 139  K 4.3 4.9  CL 107 105  CO2  --  19  GLUCOSE 117* 115*  BUN 23 21  CREATININE 1.20* 0.84    Recent Labs  09/04/13 2325 09/05/13 0442  TROPONINI 17.82* 15.24*   Hepatic Function Panel No results found for this basename: PROT, ALBUMIN, AST, ALT, ALKPHOS, BILITOT, BILIDIR, IBILI,  in the last 72 hours  Recent Labs  09/05/13 0442  CHOL 145   No results found for this basename: PROTIME,  in the last 72 hours  Imaging: Imaging results have been reviewed and Dg Chest Portable 1 View  09/04/2013   CLINICAL DATA:  Chest pain and pressure.  EXAM:  PORTABLE CHEST - 1 VIEW  COMPARISON:  02/23/2012.  FINDINGS: Trachea is midline. Heart size stable. Minimal biapical pleural thickening. There may be scarring or prominent epicardial fat along the cardiac apex, unchanged from 09/17/2010. Lungs are otherwise clear. No pleural fluid.  IMPRESSION: No acute findings.   Electronically Signed   By: Leanna Battles M.D.   On: 09/04/2013 17:49    Cardiac Studies:  Assessment/Plan:  Status post acute non-ST elevation MI Postinfarction angina Hypertension Chronic A. fib Anxiety disorder Plan Discuss with patient and her daughter regarding left cath possible PTCA stenting its risk and benefits i.e. death MI stroke need for emergency CABG local vascular complications risk of restenosis etc. and consented for PCI   LOS: 1 day    Sara Hale N 09/05/2013, 9:51 AM

## 2013-09-05 NOTE — Progress Notes (Signed)
ANTICOAGULATION CONSULT NOTE - Follow Up Consult  Pharmacy Consult for Heparin  Indication: chest pain/ACS  Allergies  Allergen Reactions  . Codeine    Patient Measurements: Height: 5\' 6"  (167.6 cm) Weight: 146 lb 13.2 oz (66.6 kg) IBW/kg (Calculated) : 59.3  Vital Signs: Temp: 97.7 F (36.5 C) (11/15 0800) Temp src: Oral (11/15 0800) BP: 137/77 mmHg (11/15 1000) Pulse Rate: 91 (11/15 1046)  Labs:  Recent Labs  09/04/13 1727 09/04/13 1747 09/04/13 1831 09/04/13 2325 09/05/13 0205 09/05/13 0442 09/05/13 1009  HGB 14.2 13.9 12.9  --   --   --  13.0  HCT 40.7 41.0 38.0  --   --   --  37.0  PLT 229  --   --   --   --   --  194  LABPROT  --   --   --   --   --  14.2  --   INR  --   --   --   --   --  1.12  --   HEPARINUNFRC  --   --   --   --  0.99*  --   --   CREATININE  --  1.20* 1.20*  --   --  0.84  --   TROPONINI  --   --   --  17.82*  --  15.24* >20.00*    Estimated Creatinine Clearance: 59.2 ml/min (by C-G formula based on Cr of 0.84).    Assessment: 67 y/o F on heparin for CP > went to cath urgently with ongoing CP and increased Tp 17>15.  PCI to ramus.  Restart heparin 8hr after seath pulled for Afib - possible to start warfarin tomorrow per Dr. Particia Jasper.   Goal of Therapy:  Heparin level 0.3-0.7 units/ml Monitor platelets by anticoagulation protocol: Yes   Plan:  Sheath pulled 1215> start heparin at 2015 drip rate 600 uts/hr Check 6hr HL  Daily HL, CBC  Leota Sauers Pharm.D. CPP, BCPS Clinical Pharmacist [phone] 09/05/2013 2:11 PM

## 2013-09-05 NOTE — Progress Notes (Signed)
Late entry: Pt to go to cath lab, hep d/c on call. VSS BP 137/77

## 2013-09-05 NOTE — Progress Notes (Signed)
CRITICAL VALUE ALERT  Critical value received:  Troponin 17.82  Date of notification:  09/05/13  Time of notification:  0110  Critical value read back:yes  Nurse who received alert:  Kerby Moors  MD notified (1st page):  Harwani  Time of first page:  0115  MD notified (2nd page):  Time of second page: 0155  Responding MD: Sharyn Lull   Time MD responded:  [phone]

## 2013-09-05 NOTE — Progress Notes (Signed)
INITIAL NUTRITION ASSESSMENT  DOCUMENTATION CODES Per approved criteria  -Non-severe (moderate) malnutrition in the context of social or environmental circumstances   INTERVENTION: Add Resource Breeze po BID, each supplement provides 250 kcal and 9 grams of protein. RD to continue to follow nutrition care plan.  NUTRITION DIAGNOSIS: Inadequate oral intaek related to poor appetite as evidenced by pt report and weight loss.   Goal: Intake to meet >90% of estimated nutrition needs.  Monitor:  weight trends, lab trends, I/O's, PO intake, supplement tolerance  Reason for Assessment: Malnutrition Screening Tool  67 y.o. female  Admitting Dx: chest pain  ASSESSMENT: PMHx significant for afib and HTN. Admitted with chest pain. Work-up ongoing.  Pt reports that she used to weight 160 lb back in 2023/04/29. Her husband passed away on Father's Day and since she has lost down to 130 lb. Pt has gained back up to 146 lb. She states that she isn't eating very well. Drank a lot of Ensure with her husband when he was sick and she doesn't want them while she's here. Pt didn't touch her breakfast. Had a Gingerale per RN. Pt about to go down for cath per RN.  Pt meets criteria for moderate MALNUTRITION in the context of social/environmental circumstances as evidenced by 9% wt loss x 5 months and intake of <75% x at least 3 months.   Height: Ht Readings from Last 1 Encounters:  09/05/13 5\' 6"  (1.676 m)    Weight: Wt Readings from Last 1 Encounters:  09/05/13 146 lb 13.2 oz (66.6 kg)    Ideal Body Weight: 130 lb  % Ideal Body Weight: 112%  Wt Readings from Last 10 Encounters:  09/05/13 146 lb 13.2 oz (66.6 kg)    Usual Body Weight: 160 lb (per pt)  % Usual Body Weight: 91%  BMI:  Body mass index is 23.71 kg/(m^2). WNL  Estimated Nutritional Needs: Kcal: 1500 - 1700 Protein: 60 - 70 g Fluid: 1.5 - 1.7 liters  Skin: intact  Diet Order: Cardiac  EDUCATION NEEDS: -No education  needs identified at this time   Intake/Output Summary (Last 24 hours) at 09/05/13 0956 Last data filed at 09/05/13 0830  Gross per 24 hour  Intake    490 ml  Output    550 ml  Net    -60 ml    Last BM: PTA  Labs:   Recent Labs Lab 09/04/13 1747 09/04/13 1831 09/05/13 0442  NA 143 143 139  K 4.4 4.3 4.9  CL 106 107 105  CO2  --   --  19  BUN 23 23 21   CREATININE 1.20* 1.20* 0.84  CALCIUM  --   --  9.0  GLUCOSE 124* 117* 115*    CBG (last 3)  No results found for this basename: GLUCAP,  in the last 72 hours  Scheduled Meds: . aspirin  324 mg Oral NOW   Or  . aspirin  300 mg Rectal NOW  . aspirin EC  81 mg Oral Daily  . carvedilol  3.125 mg Oral BID WC  . clopidogrel  75 mg Oral Q breakfast  . hydrochlorothiazide  12.5 mg Oral Daily  . lisinopril  10 mg Oral Daily  . sodium chloride  3 mL Intravenous Q12H    Continuous Infusions: . sodium chloride 50 mL/hr at 09/04/13 2120  . heparin 700 Units/hr (09/05/13 0400)  . nitroGLYCERIN 15 mcg/min (09/05/13 0400)    Past Medical History  Diagnosis Date  . Hypertension   .  Atrial fibrillation     History reviewed. No pertinent past surgical history.  Jarold Motto MS, RD, LDN Pager: [phone] After-hours pager: [phone]

## 2013-09-05 NOTE — Interval H&P Note (Signed)
Cath Lab Visit (complete for each Cath Lab visit)  Clinical Evaluation Leading to the Procedure:   ACS: yes  Non-ACS:    Anginal Classification: CCS IV  Anti-ischemic medical therapy: Maximal Therapy (2 or more classes of medications)  Non-Invasive Test Results: No non-invasive testing performed  Prior CABG: No previous CABG      History and Physical Interval Note:  09/05/2013 10:44 AM  Sara Hale  has presented today for surgery, with the diagnosis of NSTEMI  The various methods of treatment have been discussed with the patient and family. After consideration of risks, benefits and other options for treatment, the patient has consented to  Procedure(s): LEFT HEART CATHETERIZATION WITH CORONARY ANGIOGRAM (N/A) as a surgical intervention .  The patient's history has been reviewed, patient examined, no change in status, stable for surgery.  I have reviewed the patient's chart and labs.  Questions were answered to the patient's satisfaction.     Robynn Pane

## 2013-09-05 NOTE — Progress Notes (Signed)
ANTICOAGULATION CONSULT NOTE - Follow Up Consult  Pharmacy Consult for Heparin  Indication: chest pain/ACS  Allergies  Allergen Reactions  . Codeine    Patient Measurements: Height: 5\' 6"  (167.6 cm) Weight: 147 lb 4.3 oz (66.8 kg) IBW/kg (Calculated) : 59.3  Vital Signs: Temp: 97.9 F (36.6 C) (11/14 2320) Temp src: Oral (11/14 2320) BP: 122/76 mmHg (11/14 2320) Pulse Rate: 82 (11/14 2320)  Labs:  Recent Labs  09/04/13 1727 09/04/13 1747 09/04/13 1831 09/04/13 2325 09/05/13 0205  HGB 14.2 13.9 12.9  --   --   HCT 40.7 41.0 38.0  --   --   PLT 229  --   --   --   --   HEPARINUNFRC  --   --   --   --  0.99*  CREATININE  --  1.20* 1.20*  --   --   TROPONINI  --   --   --  17.82*  --     Estimated Creatinine Clearance: 41.4 ml/min (by C-G formula based on Cr of 1.2).   Medications:  Heparin 800 units/hr  Assessment: 67 y/o F on heparin for CP. First HL is elevated at 0.99, suspect some elevation from bolus given slightly elevated SCr. No issues per RN.   Goal of Therapy:  Heparin level 0.3-0.7 units/ml Monitor platelets by anticoagulation protocol: Yes   Plan:  -Decrease heparin drip to 700 units/hr -8 hour HL at 1130 -Daily CBC/HL -Monitor for bleeding -Cath on Monday per MD notes  Thank you for allowing me to take part in this patient's care,  Abran Duke, PharmD Clinical Pharmacist Phone: [phone] Pager: [phone] 09/05/2013 3:27 AM

## 2013-09-05 NOTE — Cardiovascular Report (Signed)
NAMEGIULIANA, Sara Hale                  ACCOUNT NO.:  [id number]  MEDICAL RECORD NO.:  [id number]  LOCATION:  2H27C                        FACILITY:  [facility]  PHYSICIAN:  Jaedah Lords N. Sharyn Lull, M.D. DATE OF BIRTH:  01-18-1946  DATE OF PROCEDURE:  09/05/2013 DATE OF DISCHARGE:                           CARDIAC CATHETERIZATION   PROCEDURE: 1. Left cardiac cath with selective left and right coronary     angiography, left ventriculography via right groin using Judkins     technique. 2. Successful percutaneous transluminal coronary angioplasty to distal     ramus, distal 100% occluded ramus, using initially 1.5 x 20 mm long     MINI-TREK, and then 2.0 x 12-mm long MINI-TREK balloon. 3. Successful deployment of 2.25 x 28 mm long Xience extubation drug-     eluting stent in distal ramus. 4. Successful closure of arteriotomy using Perclose.  INDICATION FOR THE PROCEDURE:  Ms. Sleeper is a 67 year old female with past medical history significant for hypertension, chronic atrial fibrillation, was admitted by Dr. Algie Coffer yesterday because of substernal chest pain described as pressure, radiating to the jaw and both shoulders, and was radiating to both shoulders, associated with mild nausea.  The patient received sublingual nitro with no relief, and was started on nitro IV nitro and heparin and with relief of chest pain. EKG done initially showed atrial fibrillation with moderate ventricular response and minor ST-T wave changes in anterolateral leads.  The patient ruled in for non-Q-wave myocardial infarction due to elevated troponin-I of 17.82, repeat was 15.24, and again this morning was above 20.  The patient did had recurrent chest pain earlier today.  Due to typical anginal chest pain, markedly elevated troponin, I discussed with the patient and her daughter at length regarding emergency left cath, possible PTCA stenting, its risks and benefits, i.e., death, MI, stroke, need for emergency CABG, local  vascular complications, etc. and consented for PCI.  PROCEDURE:  After obtaining the informed consent, the patient was brought to the cath lab and was placed on fluoroscopy table.  Right groin was prepped and draped in usual fashion.  Xylocaine 1% was used for local anesthesia in the right groin.  With the help of thin wall needle, 6-French arterial sheath was placed.  The sheath was aspirated and flushed.  Next, a 6-French left Judkins catheter was advanced over the wire under fluoroscopic guidance up to the ascending aorta.  Wire was pulled out. The catheter was aspirated and connected to the Manifold.  Catheter was further advanced and engaged into left coronary ostium.  Multiple views of the left system were taken.  Next, catheter was disengaged and was pulled out over the wire and was replaced with 6-French right Judkins catheter, which was advanced over the wire under fluoroscopic guidance up to the ascending aorta.  Wire was pulled out.  The catheter was aspirated and connected to the Manifold.  Catheter was further advanced and engaged into right coronary ostium.  Multiple views of the right system were taken.  Next, catheter was disengaged and was pulled out over the wire and was replaced with 6-French pigtail catheter, which was advanced over the wire under fluoroscopic guidance up to  the ascending aorta.  Catheter was further advanced across the aortic valve into the LV.  LV pressures were recorded.  LV graft was done in 30-degree RAO position.  Post-angiographic pressures were recorded from LV and then pullback pressures were recorded from aorta.  There was no gradient across the aortic valve.  Next, the pigtail catheter was pulled out over the wire.  Sheaths were aspirated and flushed.  FINDINGS:  LV showed mild anterolateral wall hypokinesia, EF of approximately 50-55%.  Left main was patent.  LAD was patent.  Diagonal 1 and 2 were very small.  Left circumflex was  patent.  OM1 was very, very small.  OM 2 was small, which was patent.  OM 3 was very small, which was patent.  Ramus was 100% occluded beyond midportion with filling defect distally suggestive of thrombus with TIMI 0 flow.  RCA was patent.  PDA and PLV branches were patent.  INTERVENTIONAL PROCEDURE:  Successful PTCA to the distal ramus was done using initially 1.5 x 20 mm long MINI-TREK balloon and then 2.0 x 12 mm long MINI-TREK balloon for predilatation and then 2.25 x 28 mm long Xience drug-eluting stent was deployed at 4 atmospheric pressure in distal ramus, which was fully expanded going up to 6 atmospheric pressure.  Two inflations were done.  The lesion was dilated from 100% to 0% residual with excellent TIMI grade 3 distal flow without evidence of dissection or distal embolization.  The patient received weight-based Angiomax, 300 mg additional of Plavix during the procedure.  The patient tolerated the procedure well.  There were no complications.  Arteriotomy was closed using Perclose at the end of the procedure without any complications.  The patient was transferred to the step-down unit in stable condition.     Eduardo Osier. Sharyn Lull, M.D.     [facility]/MEDQ  D:  09/05/2013  T:  09/05/2013  Job:  [id number]

## 2013-09-05 NOTE — H&P (View-Only) (Signed)
Subjective:  Patient complained of recurrent chest pain earlier today. Noted to have significantly elevated troponin I. EKG showed atrial fibrillation with controlled ventricular response with minor ST-T wave changes   Obj the  mildly speak with her she last 24 hours: Temp:  [97.7 F (36.5 C)-98 F (36.7 C)] 97.7 F (36.5 C) (11/15 0800) Pulse Rate:  [53-106] 78 (11/15 0800) Resp:  [15-22] 15 (11/15 0800) BP: (105-163)/(65-114) 114/65 mmHg (11/15 0800) SpO2:  [92 %-98 %] 96 % (11/15 0800) Weight:  [58.968 kg (130 lb)-66.8 kg (147 lb 4.3 oz)] 66.6 kg (146 lb 13.2 oz) (11/15 0647)  Intake/Output from previous day: 11/14 0701 - 11/15 0700 In: 490 [P.O.:240; I.V.:250] Out: 300 [Urine:300] Intake/Output from this shift: Total I/O In: -  Out: 250 [Urine:250]  Physical Exam: Neck: no adenopathy, no carotid bruit, no JVD and supple, symmetrical, trachea midline Lungs: clear to auscultation bilaterally Heart: irregularly irregular rhythm, S1, S2 normal and Soft systolic murmur noted no S3 gallop Abdomen: soft, non-tender; bowel sounds normal; no masses,  no organomegaly Extremities: extremities normal, atraumatic, no cyanosis or edema  Lab Results:  Recent Labs  09/04/13 1727 09/04/13 1747 09/04/13 1831  WBC 8.5  --   --   HGB 14.2 13.9 12.9  PLT 229  --   --     Recent Labs  09/04/13 1831 09/05/13 0442  NA 143 139  K 4.3 4.9  CL 107 105  CO2  --  19  GLUCOSE 117* 115*  BUN 23 21  CREATININE 1.20* 0.84    Recent Labs  09/04/13 2325 09/05/13 0442  TROPONINI 17.82* 15.24*   Hepatic Function Panel No results found for this basename: PROT, ALBUMIN, AST, ALT, ALKPHOS, BILITOT, BILIDIR, IBILI,  in the last 72 hours  Recent Labs  09/05/13 0442  CHOL 145   No results found for this basename: PROTIME,  in the last 72 hours  Imaging: Imaging results have been reviewed and Dg Chest Portable 1 View  09/04/2013   CLINICAL DATA:  Chest pain and pressure.  EXAM:  PORTABLE CHEST - 1 VIEW  COMPARISON:  02/23/2012.  FINDINGS: Trachea is midline. Heart size stable. Minimal biapical pleural thickening. There may be scarring or prominent epicardial fat along the cardiac apex, unchanged from 09/17/2010. Lungs are otherwise clear. No pleural fluid.  IMPRESSION: No acute findings.   Electronically Signed   By: Melinda  Blietz M.D.   On: 09/04/2013 17:49    Cardiac Studies:  Assessment/Plan:  Status post acute non-ST elevation MI Postinfarction angina Hypertension Chronic A. fib Anxiety disorder Plan Discuss with patient and her daughter regarding left cath possible PTCA stenting its risk and benefits i.e. death MI stroke need for emergency CABG local vascular complications risk of restenosis etc. and consented for PCI   LOS: 1 day    Shelitha Magley N 09/05/2013, 9:51 AM    

## 2013-09-06 LAB — BASIC METABOLIC PANEL
BUN: 12 mg/dL (ref 6–23)
CO2: 24 mEq/L (ref 19–32)
Chloride: 108 mEq/L (ref 96–112)
GFR calc Af Amer: 63 mL/min — ABNORMAL LOW (ref >90)
GFR calc non Af Amer: 54 mL/min — ABNORMAL LOW (ref >90)
Glucose, Bld: 106 mg/dL — ABNORMAL HIGH (ref 70–99)
Potassium: 4 mEq/L (ref 3.5–5.1)
Sodium: 140 mEq/L (ref 135–145)

## 2013-09-06 LAB — CBC
HCT: 40.7 % (ref 36.0–46.0)
Hemoglobin: 13.9 g/dL (ref 12.0–15.0)
MCH: 31.9 pg (ref 26.0–34.0)
MCHC: 34.2 g/dL (ref 30.0–36.0)
MCV: 93.3 fL (ref 78.0–100.0)
RBC: 4.36 MIL/uL (ref 3.87–5.11)

## 2013-09-06 MED ORDER — METOPROLOL TARTRATE 1 MG/ML IV SOLN
5.0000 mg | INTRAVENOUS | Status: DC | PRN
  Administered 2013-09-06 (×2): 5 mg via INTRAVENOUS
  Filled 2013-09-06: qty 5

## 2013-09-06 MED ORDER — METOPROLOL TARTRATE 25 MG PO TABS
25.0000 mg | ORAL_TABLET | Freq: Three times a day (TID) | ORAL | Status: DC
  Administered 2013-09-06 (×2): 25 mg via ORAL
  Filled 2013-09-06 (×5): qty 1

## 2013-09-06 MED ORDER — APIXABAN 5 MG PO TABS
5.0000 mg | ORAL_TABLET | Freq: Two times a day (BID) | ORAL | Status: DC
  Filled 2013-09-06 (×2): qty 1

## 2013-09-06 MED ORDER — METOPROLOL TARTRATE 1 MG/ML IV SOLN
5.0000 mg | Freq: Once | INTRAVENOUS | Status: AC
  Administered 2013-09-06: 5 mg via INTRAVENOUS

## 2013-09-06 MED ORDER — METOPROLOL TARTRATE 1 MG/ML IV SOLN: 2.5000 mg | INTRAVENOUS | Status: DC | PRN

## 2013-09-06 MED ORDER — APIXABAN 2.5 MG PO TABS
2.5000 mg | ORAL_TABLET | Freq: Two times a day (BID) | ORAL | Status: DC
  Administered 2013-09-06 – 2013-09-08 (×5): 2.5 mg via ORAL
  Filled 2013-09-06 (×7): qty 1

## 2013-09-06 MED ORDER — METOPROLOL TARTRATE 1 MG/ML IV SOLN
INTRAVENOUS | Status: AC
  Filled 2013-09-06: qty 10

## 2013-09-06 MED ORDER — METOPROLOL TARTRATE 25 MG PO TABS
25.0000 mg | ORAL_TABLET | Freq: Two times a day (BID) | ORAL | Status: DC
  Administered 2013-09-06: 25 mg via ORAL
  Filled 2013-09-06: qty 1

## 2013-09-06 NOTE — Progress Notes (Signed)
Subjective:  Patient denies any chest pain or shortness of breath. Had episode of A. fib with RVR earlier today received IV Lopressor with control of heart rate in 90s to low 100. Carvedilol switched to by mouth Lopressor. Discussed with patient regarding newer anticoagulants versus Coumadin patient  agrees forEliquis.. Will get social service consult for help with medications Objective:  Vital Signs in the last 24 hours: Temp:  [97.5 F (36.4 C)-98 F (36.7 C)] 98 F (36.7 C) (11/16 0800) Pulse Rate:  [75-118] 97 (11/16 0900) Resp:  [11-26] 18 (11/16 0400) BP: (115-142)/(58-94) 133/86 mmHg (11/16 0900) SpO2:  [90 %-99 %] 95 % (11/16 0900) Weight:  [68.3 kg (150 lb 9.2 oz)] 68.3 kg (150 lb 9.2 oz) (11/16 0350)  Intake/Output from previous day: 11/15 0701 - 11/16 0700 In: 3282.9 [P.O.:300; I.V.:2982.9] Out: 1425 [Urine:1425] Intake/Output from this shift: Total I/O In: 688 [P.O.:220; I.V.:468] Out: 450 [Urine:450]  Physical Exam: Neck: no adenopathy, no carotid bruit, no JVD and supple, symmetrical, trachea midline Lungs: clear to auscultation bilaterally Heart: irregularly irregular rhythm, S1, S2 normal and Soft systolic murmur noted Abdomen: soft, non-tender; bowel sounds normal; no masses,  no organomegaly Extremities: extremities normal, atraumatic, no cyanosis or edema and Right groin stable  Lab Results:  Recent Labs  09/05/13 1009 09/06/13 0600  WBC 10.9* 10.6*  HGB 13.0 13.9  PLT 194 176    Recent Labs  09/05/13 0442 09/06/13 0600  NA 139 140  K 4.9 4.0  CL 105 108  CO2 19 24  GLUCOSE 115* 106*  BUN 21 12  CREATININE 0.84 1.03    Recent Labs  09/05/13 1009 09/06/13 0600  TROPONINI >20.00* >20.00*   Hepatic Function Panel No results found for this basename: PROT, ALBUMIN, AST, ALT, ALKPHOS, BILITOT, BILIDIR, IBILI,  in the last 72 hours  Recent Labs  09/05/13 0442  CHOL 145   No results found for this basename: PROTIME,  in the last 72  hours  Imaging: Imaging results have been reviewed and Dg Chest Portable 1 View  09/04/2013   CLINICAL DATA:  Chest pain and pressure.  EXAM: PORTABLE CHEST - 1 VIEW  COMPARISON:  02/23/2012.  FINDINGS: Trachea is midline. Heart size stable. Minimal biapical pleural thickening. There may be scarring or prominent epicardial fat along the cardiac apex, unchanged from 09/17/2010. Lungs are otherwise clear. No pleural fluid.  IMPRESSION: No acute findings.   Electronically Signed   By: Leanna Battles M.D.   On: 09/04/2013 17:49    Cardiac Studies:  Assessment/Plan:  Status post acute non-Q-wave myocardial infarction status post PCI to 100% occluded ramus with excellent results Hypertension Chronic atrial fibrillation Anxiety disorder Hypercholesteremia Plan DC heparin start eliquis as per orders Increase Lopressor as per orders Social service consult Phase I cardiac rehabilitation Check labs in a.m.  LOS: 2 days    Raniah Karan N 09/06/2013, 10:57 AM

## 2013-09-06 NOTE — Progress Notes (Signed)
ANTICOAGULATION CONSULT NOTE - Follow Up Consult  Pharmacy Consult for Heparin > Apixiban Indication: Afib  Allergies  Allergen Reactions  . Codeine    Patient Measurements: Height: 5\' 6"  (167.6 cm) Weight: 150 lb 9.2 oz (68.3 kg) IBW/kg (Calculated) : 59.3  Vital Signs: Temp: 98.2 F (36.8 C) (11/16 1113) Temp src: Oral (11/16 1113) BP: 106/66 mmHg (11/16 1100) Pulse Rate: 94 (11/16 1100)  Labs:  Recent Labs  09/04/13 1727  09/04/13 1831  09/04/13 2325 09/05/13 0205 09/05/13 0442 09/05/13 1009 09/06/13 0600  HGB 14.2  < > 12.9  --   --   --   --  13.0 13.9  HCT 40.7  < > 38.0  --   --   --   --  37.0 40.7  PLT 229  --   --   --   --   --   --  194 176  LABPROT  --   --   --   --   --   --  14.2  --   --   INR  --   --   --   --   --   --  1.12  --   --   HEPARINUNFRC  --   --   --   --   --  0.99*  --   --  0.28*  CREATININE  --   < > 1.20*  --   --   --  0.84  --  1.03  TROPONINI  --   --   --   < > 17.82*  --  15.24* >20.00* >20.00*  < > = values in this interval not displayed.  Estimated Creatinine Clearance: 48.3 ml/min (by C-G formula based on Cr of 1.03).    Assessment: 67 y/o F on heparin for CP > went to cath urgently with ongoing CP and increased Tp 17>15.  PCI to ramus.  Restart heparin 8hr after sheath pulled for Afib. Heparin level 0.3 heparin drip 600 uts/hr.   Pt agreeable to try NOAC for AFib - will get CM consult for cost d/t pt med non-compliant in past d/t cost. Scr 1, wt > 60, age < 75 but will be on triple tx AC so will decrease NOC dose while on clop/asa/NOAC   Goal of Therapy:  Heparin level 0.3-0.7 units/ml Monitor platelets by anticoagulation protocol: Yes   Plan:   D/c heparin drip  Apixiban 2.5mg  BID   Leota Sauers Pharm.D. CPP, BCPS Clinical Pharmacist [phone] 09/06/2013 11:24 AM

## 2013-09-06 NOTE — Progress Notes (Signed)
Pt with elevated HR 180's with pt complaints of increasing shob and difficulty "catching my breath"; VS per flowsheet; MD aware, orders received; EKG 12 lead shows a.fib with RVR; IV lopressor given; HR decreasing to 90's; will continue to closely monitor

## 2013-09-06 NOTE — Progress Notes (Signed)
  Echocardiogram 2D Echocardiogram has been performed.  Sara Hale Sara Hale 09/06/2013, 11:33 AM

## 2013-09-07 LAB — CBC
HCT: 33.5 % — ABNORMAL LOW (ref 36.0–46.0)
MCV: 91.5 fL (ref 78.0–100.0)
RDW: 13.5 % (ref 11.5–15.5)
WBC: 8.4 10*3/uL (ref 4.0–10.5)

## 2013-09-07 LAB — BASIC METABOLIC PANEL
BUN: 13 mg/dL (ref 6–23)
CO2: 25 mEq/L (ref 19–32)
Calcium: 8.2 mg/dL — ABNORMAL LOW (ref 8.4–10.5)
Chloride: 108 mEq/L (ref 96–112)
Creatinine, Ser: 0.95 mg/dL (ref 0.50–1.10)
GFR calc Af Amer: 69 mL/min — ABNORMAL LOW (ref >90)
GFR calc non Af Amer: 60 mL/min — ABNORMAL LOW (ref >90)
Sodium: 142 mEq/L (ref 135–145)

## 2013-09-07 LAB — TROPONIN I: Troponin I: 9.62 ng/mL (ref <0.30)

## 2013-09-07 MED ORDER — AMIODARONE HCL 200 MG PO TABS
200.0000 mg | ORAL_TABLET | Freq: Two times a day (BID) | ORAL | Status: DC
  Administered 2013-09-07 – 2013-09-08 (×3): 200 mg via ORAL
  Filled 2013-09-07 (×4): qty 1

## 2013-09-07 MED ORDER — POTASSIUM CHLORIDE CRYS ER 20 MEQ PO TBCR
20.0000 meq | EXTENDED_RELEASE_TABLET | Freq: Once | ORAL | Status: AC
  Administered 2013-09-08: 20 meq via ORAL
  Filled 2013-09-07: qty 1

## 2013-09-07 MED ORDER — AMIODARONE HCL 200 MG PO TABS: 200.0000 mg | ORAL_TABLET | Freq: Every day | ORAL | Status: DC

## 2013-09-07 MED ORDER — METOPROLOL TARTRATE 50 MG PO TABS
50.0000 mg | ORAL_TABLET | Freq: Three times a day (TID) | ORAL | Status: DC
  Administered 2013-09-07 – 2013-09-08 (×4): 50 mg via ORAL
  Filled 2013-09-07 (×6): qty 1

## 2013-09-07 MED FILL — Sodium Chloride IV Soln 0.9%: INTRAVENOUS | Qty: 50 | Status: AC

## 2013-09-07 NOTE — Progress Notes (Signed)
Subjective:  Feeling better. Heart rate in low hundreds. Afebrile. Preserved LV systolic function with moderate TR.  Objective:  Vital Signs in the last 24 hours: Temp:  [98.2 F (36.8 C)-98.6 F (37 C)] 98.2 F (36.8 C) (11/17 2004) Pulse Rate:  [89-102] 101 (11/17 0730) Cardiac Rhythm:  [-] Atrial fibrillation (11/17 2004) Resp:  [18-20] 20 (11/17 1530) BP: (85-158)/(51-113) 128/80 mmHg (11/17 2004) SpO2:  [90 %-96 %] 94 % (11/17 2004) Weight:  [68.5 kg (151 lb 0.2 oz)] 68.5 kg (151 lb 0.2 oz) (11/17 0500)  Physical Exam: BP Readings from Last 1 Encounters:  09/07/13 128/80     Wt Readings from Last 1 Encounters:  09/07/13 68.5 kg (151 lb 0.2 oz)    Weight change: 0.2 kg (7.1 oz)  HEENT: [place]/AT, Eyes-Blue, PERL, EOMI, Conjunctiva-Pink, Sclera-Non-icteric Neck: No JVD, No bruit, Trachea midline. Lungs:  Clear, Bilateral. Cardiac:  Irregular rhythm, normal S1 and S2, no S3. II/VI systolic murmur left sternal border. Abdomen:  Soft, non-tender. Extremities:  No edema present. No cyanosis. No clubbing. CNS: AxOx3, Cranial nerves grossly intact, moves all 4 extremities. Right handed. Skin: Warm and dry.   Intake/Output from previous day: 11/16 0701 - 11/17 0700 In: 2523 [P.O.:1005; I.V.:1518] Out: 2120 [Urine:2120]    Lab Results: BMET    Component Value Date/Time   NA 142 09/07/2013 0500   K 3.5 09/07/2013 0500   CL 108 09/07/2013 0500   CO2 25 09/07/2013 0500   GLUCOSE 96 09/07/2013 0500   BUN 13 09/07/2013 0500   CREATININE 0.95 09/07/2013 0500   CALCIUM 8.2* 09/07/2013 0500   GFRNONAA 60* 09/07/2013 0500   GFRAA 69* 09/07/2013 0500   CBC    Component Value Date/Time   WBC 8.4 09/07/2013 0500   RBC 3.66* 09/07/2013 0500   HGB 11.6* 09/07/2013 0500   HCT 33.5* 09/07/2013 0500   PLT 141* 09/07/2013 0500   MCV 91.5 09/07/2013 0500   MCH 31.7 09/07/2013 0500   MCHC 34.6 09/07/2013 0500   RDW 13.5 09/07/2013 0500   LYMPHSABS 1.2 09/04/2013 1727   MONOABS  0.4 09/04/2013 1727   EOSABS 0.1 09/04/2013 1727   BASOSABS 0.1 09/04/2013 1727   CARDIAC ENZYMES Lab Results  Component Value Date   CKTOTAL 47 09/18/2010   CKMB 1.0 09/18/2010   TROPONINI 9.62* 09/07/2013    Scheduled Meds: . amiodarone  200 mg Oral BID  . apixaban  2.5 mg Oral BID  . aspirin  81 mg Oral Daily  . atorvastatin  80 mg Oral q1800  . clopidogrel  75 mg Oral Q breakfast  . feeding supplement (RESOURCE BREEZE)  1 Container Oral BID BM  . lisinopril  10 mg Oral Daily  . metoprolol tartrate  50 mg Oral TID  . sodium chloride  3 mL Intravenous Q12H   Continuous Infusions: . sodium chloride Stopped (09/05/13 1400)  . sodium chloride    . sodium chloride Stopped (09/06/13 1600)  . nitroGLYCERIN Stopped (09/05/13 1400)   PRN Meds:.sodium chloride, acetaminophen, ALPRAZolam, metoprolol, nitroGLYCERIN, ondansetron (ZOFRAN) IV, sodium chloride  Assessment/Plan: Acute non-Q-wave myocardial infarction Status post PCI to 100% occluded ramus  Hypertension  Chronic atrial fibrillation  Anxiety disorder  Hypercholesteremia  Add amiodarone for heart rate control.   LOS: 3 days    Orpah Cobb  MD  09/07/2013, 11:36 PM

## 2013-09-07 NOTE — Progress Notes (Signed)
CARDIAC REHAB PHASE I   PRE:  Rate/Rhythm: 85 afib  BP:  Supine:   Sitting: 100/71  Standing:    SaO2:   MODE:  Ambulation: 170 ft   POST:  Rate/Rhythm: 136 afib  BP:  Supine:   Sitting: 155/77  Standing:    SaO2: 96%RA 1345-1417 Pt walked 170 ft on RA with hand held asst. Pt stated she felt as if she was having to pick right foot up when walking that it kind of wanted to drag. Did not notice during walk. She then said may just be the fact that she had not been up and walking for a few days. Heart rate to 136 during short walk. S/o feeling palpitations when sitting in recliner after walk. No CP. Gave pt MI booklet and reviewed restrictions.  Will continue ed tomorrow.    Luetta Nutting, RN BSN  09/07/2013 2:14 PM

## 2013-09-07 NOTE — Care Management Note (Addendum)
    Page 1 of 1   09/08/2013     10:26:22 AM   CARE MANAGEMENT NOTE 09/08/2013  Patient:  Sara Hale, Sara Hale   Account Number:  [id number]  Date Initiated:  09/07/2013  Documentation initiated by:  Junius Creamer  Subjective/Objective Assessment:   adm w mi     Action/Plan:   lives alone   Anticipated DC Date:  09/08/2013   Anticipated DC Plan:  HOME/SELF CARE      DC Planning Services  CM consult  Medication Assistance      Choice offered to / List presented to:             Status of service:   Medicare Important Message given?   (If response is "NO", the following Medicare IM given date fields will be blank) Date Medicare IM given:   Date Additional Medicare IM given:    Discharge Disposition:  HOME/SELF CARE  Per UR Regulation:  Reviewed for med. necessity/level of care/duration of stay  If discussed at Long Length of Stay Meetings, dates discussed:    Comments:  11/18  1001 debbie Cruze Zingaro rn,bsn have faxed pt assist form to [employer] squibb for eliquis. will also have sec mail in original form.  11/17 1001 debbie Shateria Paternostro rn,bsn spoke w pt and md. pt given 30day eliquis card. she does not have medicare d plan. enc pt to sign up for supplement and medicare d plan. she will ck into this. she and md to discuss eliquis vs coumadin. will put eliquis pt assist form on shadow chart also.

## 2013-09-07 NOTE — Progress Notes (Signed)
Chaplain was provided with a spiritual consult to meet with pt.  Pt had experienced loss and life transitions in the past year.  Pt has a deep sense of faith and relies heavily on that faith for emotional support.  Chaplain provided empathic listening and emotional and grief support.  Additionally, chaplain offered pastoral counseling and guidance regarding the pt's recent church experience.  Chaplain ended the visit in prayer with the pt and will follow up if needed or requested.

## 2013-09-08 LAB — BASIC METABOLIC PANEL
BUN: 16 mg/dL (ref 6–23)
CO2: 25 mEq/L (ref 19–32)
Chloride: 107 mEq/L (ref 96–112)
Creatinine, Ser: 0.99 mg/dL (ref 0.50–1.10)
GFR calc non Af Amer: 57 mL/min — ABNORMAL LOW (ref >90)

## 2013-09-08 LAB — CBC
HCT: 36.4 % (ref 36.0–46.0)
MCH: 31 pg (ref 26.0–34.0)
MCHC: 33.5 g/dL (ref 30.0–36.0)
MCV: 92.6 fL (ref 78.0–100.0)
RDW: 13.5 % (ref 11.5–15.5)
WBC: 9.5 10*3/uL (ref 4.0–10.5)

## 2013-09-08 LAB — POCT ACTIVATED CLOTTING TIME: Activated Clotting Time: 391 seconds

## 2013-09-08 MED ORDER — NITROGLYCERIN 0.4 MG SL SUBL: 0.4000 mg | SUBLINGUAL_TABLET | SUBLINGUAL | Status: DC | PRN

## 2013-09-08 MED ORDER — ALPRAZOLAM 0.25 MG PO TABS: 0.2500 mg | ORAL_TABLET | Freq: Every evening | ORAL | Status: AC | PRN

## 2013-09-08 MED ORDER — ASPIRIN 81 MG PO TABS: 81.0000 mg | ORAL_TABLET | Freq: Every day | ORAL | Status: AC

## 2013-09-08 MED ORDER — CLOPIDOGREL BISULFATE 75 MG PO TABS: 75.0000 mg | ORAL_TABLET | Freq: Every day | ORAL | Status: DC

## 2013-09-08 MED ORDER — ASPIRIN 81 MG PO TABS: 325.0000 mg | ORAL_TABLET | Freq: Every day | ORAL | Status: DC

## 2013-09-08 MED ORDER — ATORVASTATIN CALCIUM 80 MG PO TABS: 80.0000 mg | ORAL_TABLET | Freq: Every day | ORAL | Status: AC

## 2013-09-08 MED ORDER — POTASSIUM CHLORIDE ER 10 MEQ PO TBCR: 10.0000 meq | EXTENDED_RELEASE_TABLET | Freq: Every day | ORAL | Status: DC

## 2013-09-08 MED ORDER — AMIODARONE HCL 200 MG PO TABS: 200.0000 mg | ORAL_TABLET | Freq: Every day | ORAL | Status: DC

## 2013-09-08 MED ORDER — APIXABAN 2.5 MG PO TABS: 2.5000 mg | ORAL_TABLET | Freq: Two times a day (BID) | ORAL | Status: AC

## 2013-09-08 NOTE — Progress Notes (Signed)
CARDIAC REHAB PHASE I   PRE:  Rate/Rhythm: 86 afib    BP: sitting 133/92    SaO2:   MODE:  Ambulation: 270 ft   POST:  Rate/Rhythm: 120 afib    BP: sitting 141/89      SaO2:   Pt contineues to c/o weak legs with walking. Sts its a tired feeling in her thighs. ? If r/t afib. Slow pace with little steps. Pt sts she is active at home with her house and yard work but has to take breaks. Gave modified ex gl, can't imagine her being able to ex 30 min. Ed completed. Not interested in CRPII, sts she gets enough ex at home. Needs reinforcement of Plavix and NTG. [id number]   Elissa Lovett [place] CES, ACSM 09/08/2013 11:08 AM

## 2013-09-08 NOTE — Discharge Summary (Signed)
Physician Discharge Summary  Patient ID: Sara Hale MRN: [id number] DOB/AGE: June 20, 1946 67 y.o.  Admit date: 09/04/2013 Discharge date: 09/08/2013  Admission Diagnoses: Chest pain  Hypertension  Chronic atrial fibrillation  Anxiety disorder  Hypercholesteremia  Discharge Diagnoses:  Principle Problem: * Acute non-Q-wave myocardial infarction * Status post PCI to 100% occluded ramus  Hypertension  Chronic atrial fibrillation  Anxiety disorder  Hypercholesteremia  Discharged Condition: fair  Hospital Course: 67 year old female with 1 year history of atrial fibrillation without anticoagulation due to cost of the medication had substernal pressure type chest pain radiating to jaw and both shoulder. With abnormal cardiac enzymes and ongoing chest pain she underwent cardiac catheterization followed by acute angioplasty and stent placement in 100 % occluded distal Ramus with excellent result by Dr. Sharyn Lull.  She had low dose amiodarone and B-blocker for her chronic atrial fibrillation with rapid ventricular response.  A patient assistance form has been filled out for Eliquis (Previously not covered by her Insurance plan. She was discharged home in stable condition with follow up by me in 1 week.   Consults: cardiology  Significant Diagnostic Studies: labs: Normal CBC and near normal BMET with improved renal function post hydration.  EKG-Atrial fibrillation with rapid ventricular response.  Echocardiogram: - Left ventricle: The cavity size was normal. Systolic function was normal. The estimated ejection fraction was in the range of 55% to 60%. There is mild hypokinesis of the inferolateral myocardium. - Right atrium: The atrium was mildly dilated. - Tricuspid valve: Moderate regurgitation. - Pulmonary arteries: Systolic pressure was mildly increased. PA peak pressure: 32mm Hg (S).  Cardiac catheterization with angioplasty 1. Successful percutaneous transluminal coronary angioplasty  to distal ramus, distal 100% occluded ramus, using initially 1.5 x 20 mm long MINI-TREK, and then 2.0 x 12-mm long MINI-TREK balloon.  2. Successful deployment of 2.25 x 28 mm long Xience extubation drug-eluting stent in distal ramus. 3. Normal other coronaries.  Treatments: IV hydration, cardiac meds: Aspirin, Plavix, Eliquis, lisinopril (Zestril), carvedilol, amiodarone and Lipititor and 2.25 x 28 mm drug-eluting stent in distal Ramus of left circumflex coronary artery.  Discharge Exam: Blood pressure 143/89, pulse 101, temperature 98.3 F (36.8 C), temperature source Oral, resp. rate 16, height 5\' 6"  (1.676 m), weight 68.8 kg (151 lb 10.8 oz), SpO2 96.00%.  PHYSICAL EXAMINATION:  GENERAL: Well-nourished, well-developed pleasant white female in no distress.  HEENT: Normocephalic, atruamatic, Blue eyes, Conjunctivae-pink, Sclera- white.  NECK: Supple without thyromegaly, adenopathy, JVD.  CHEST: Symmetrical excursion. Clear to auscultation.  HEART: Irregular rate and rhythm. S1, S2 normal. Grade II/VI systolic murmur, no rubs, clicks or gallops.  ABDOMEN: Non distended and non-tender. Bowel sounds present.  EXTREMITIES: Negative cyanosis, edema or clubbing.  CNS: Moves all 4 extremities.   Disposition: 01-Home, Self care.     Medication List         ALPRAZolam 0.25 MG tablet  Commonly known as:  XANAX  Take 1 tablet (0.25 mg total) by mouth at bedtime as needed for anxiety.     amiodarone 200 MG tablet  Commonly known as:  PACERONE  Take 1 tablet (200 mg total) by mouth daily.     apixaban 2.5 MG Tabs tablet  Commonly known as:  ELIQUIS  Take 1 tablet (2.5 mg total) by mouth 2 (two) times daily.     aspirin 81 MG tablet  Take 1 tablets (81 mg total) by mouth daily.     atorvastatin 80 MG tablet  Commonly known as:  LIPITOR  Take 1 tablet (80 mg total) by mouth daily at 6 PM.     calcium-vitamin D 500-200 MG-UNIT per tablet  Commonly known as:  OSCAL WITH D  Take 1  tablet by mouth daily.     carvedilol 3.125 MG tablet  Commonly known as:  COREG  Take 3.125 mg by mouth 2 (two) times daily with a meal.     clopidogrel 75 MG tablet  Commonly known as:  PLAVIX  Take 1 tablet (75 mg total) by mouth daily with breakfast.     hydrochlorothiazide 12.5 MG tablet  Commonly known as:  HYDRODIURIL  Take 12.5 mg by mouth daily.     lisinopril 20 MG tablet  Commonly known as:  PRINIVIL,ZESTRIL  Take 10 mg by mouth daily.     multivitamin with minerals tablet  Take 1 tablet by mouth daily.     nitroGLYCERIN 0.4 MG SL tablet  Commonly known as:  NITROSTAT  Place 1 tablet (0.4 mg total) under the tongue every 5 (five) minutes x 3 doses as needed for chest pain.     potassium chloride 10 MEQ tablet  Commonly known as:  K-DUR  Take 1 tablet (10 mEq total) by mouth daily.           Follow-up Information   Follow up with [facility] S, MD. Schedule an appointment as soon as possible for a visit in 1 week.   Specialty:  Cardiology   Contact information:   [address] [place] [address] [phone]       Signed: Ricki Rodriguez 09/08/2013, 9:27 AM

## 2013-09-08 NOTE — Clinical Social Work Psychosocial (Addendum)
LATE ENTRY  Clinical Social Work Department BRIEF PSYCHOSOCIAL ASSESSMENT 09/08/2013  Patient:  Sara Hale, Sara Hale     Account Number:  [id number]     Admit date:  09/04/2013  Clinical Social Worker:  Varney Biles  Date/Time:  09/07/2013 04:30 PM  Referred by:  Physician  Date Referred:  09/07/2013 Referred for  Other - See comment   Other Referral:   Referred for medication assistance and change in support system (husband died in 2023-04-05).   Interview type:  Patient Other interview type:    PSYCHOSOCIAL DATA Living Status:  ALONE Admitted from facility:   Level of care:   Primary support name:  Dellia Nims Primary support relationship to patient:  CHILD, ADULT Degree of support available:   Great--pt has two adult children and seven grandchildren. Adult children live close to her home and help her with ADLs and provide support.    CURRENT CONCERNS Current Concerns  Other - See comment   Other Concerns:   Pt needs assistance affording medications and has recent change in supports (husband died in 2023/04/05).    SOCIAL WORK ASSESSMENT / PLAN CSW conferred with RNCM, who informed CSW that she is taking care of the medications referral (providing information to pt and assisting her with affording medications). CSW went into pt's room and introduced self, expressing condolences for loss of pt's husband. Pt and her husband were married for just over 60 years, and have two children and seven grandchildren. Her children live close to her home and help with support and running errands. CSW engaged pt in life review, asking questions about her husband and her upbringing. Pt actively engaged in conversation, telling stories about her work experience and the difficulties of caring for her dying husband. CSW provided support and offered to come by again tomorrow. Pt says she would appreciate this.   Assessment/plan status:  Psychosocial Support/Ongoing Assessment of Needs Other assessment/  plan:   Information/referral to community resources:   N/A. CSW continues to provide support concerning the death of pt's husband in 04-05-23.    PATIENT'S/FAMILY'S RESPONSE TO PLAN OF CARE: Pt pleasant and welcoming of CSW visit and conversation.       Maryclare Labrador, MSW, [facility] Clinical Social Worker [phone]

## 2013-10-14 ENCOUNTER — Emergency Department (HOSPITAL_COMMUNITY)
Admission: EM | Admit: 2013-10-14 | Discharge: 2013-10-14 | Disposition: A | Discharge status: Home / Self Care | Payer: Medicare Other | Attending: Emergency Medicine | Admitting: Emergency Medicine

## 2013-10-14 ENCOUNTER — Emergency Department (HOSPITAL_COMMUNITY): Payer: Medicare Other

## 2013-10-14 ENCOUNTER — Encounter (HOSPITAL_COMMUNITY): Payer: Self-pay | Admitting: Emergency Medicine

## 2013-10-14 DIAGNOSIS — Z9861 Coronary angioplasty status: Secondary | ICD-10-CM | POA: Insufficient documentation

## 2013-10-14 DIAGNOSIS — I4891 Unspecified atrial fibrillation: Secondary | ICD-10-CM | POA: Insufficient documentation

## 2013-10-14 DIAGNOSIS — Z7982 Long term (current) use of aspirin: Secondary | ICD-10-CM | POA: Insufficient documentation

## 2013-10-14 DIAGNOSIS — R209 Unspecified disturbances of skin sensation: Secondary | ICD-10-CM | POA: Insufficient documentation

## 2013-10-14 DIAGNOSIS — I10 Essential (primary) hypertension: Secondary | ICD-10-CM | POA: Insufficient documentation

## 2013-10-14 DIAGNOSIS — Z79899 Other long term (current) drug therapy: Secondary | ICD-10-CM | POA: Insufficient documentation

## 2013-10-14 LAB — BASIC METABOLIC PANEL
CO2: 28 mEq/L (ref 19–32)
Calcium: 9.5 mg/dL (ref 8.4–10.5)
GFR calc Af Amer: 54 mL/min — ABNORMAL LOW (ref >90)
GFR calc non Af Amer: 47 mL/min — ABNORMAL LOW (ref >90)
Potassium: 3.8 mEq/L (ref 3.5–5.1)
Sodium: 144 mEq/L (ref 135–145)

## 2013-10-14 LAB — CBC
MCH: 31.5 pg (ref 26.0–34.0)
MCHC: 34.4 g/dL (ref 30.0–36.0)
Platelets: 226 10*3/uL (ref 150–400)
RBC: 4.45 MIL/uL (ref 3.87–5.11)

## 2013-10-14 LAB — POCT I-STAT TROPONIN I: Troponin i, poc: 0.01 ng/mL (ref 0.00–0.08)

## 2013-10-14 MED ORDER — AMLODIPINE BESYLATE 5 MG PO TABS
5.0000 mg | ORAL_TABLET | Freq: Once | ORAL | Status: AC
  Administered 2013-10-14: 5 mg via ORAL
  Filled 2013-10-14: qty 1

## 2013-10-14 MED ORDER — ASPIRIN 81 MG PO CHEW
243.0000 mg | CHEWABLE_TABLET | Freq: Once | ORAL | Status: AC
  Administered 2013-10-14: 243 mg via ORAL
  Filled 2013-10-14: qty 3

## 2013-10-14 MED ORDER — AMLODIPINE BESYLATE 10 MG PO TABS: 5.0000 mg | ORAL_TABLET | Freq: Two times a day (BID) | ORAL | Status: DC

## 2013-10-14 NOTE — ED Notes (Signed)
Pt given d/c instructions and verbalized understanding. NAD at this time.  

## 2013-10-14 NOTE — ED Provider Notes (Signed)
Medical screening examination/treatment/procedure(s) were conducted as a shared visit with non-physician practitioner(s) and myself.  I personally evaluated the patient during the encounter.  EKG Interpretation    Date/Time:  Wednesday October 14 2013 16:44:43 EST Ventricular Rate:  107 PR Interval:    QRS Duration: 86 QT Interval:  346 QTC Calculation: 461 R Axis:   107 Text Interpretation:  Atrial fibrillation with rapid ventricular response Rightward axis Low voltage QRS ST \\T \ T wave abnormality, consider inferolateral ischemia Abnormal ECG No significant change since last tracing Confirmed by Tanysha Quant  MD, Treavor Blomquist ([id number]) on 10/14/2013 4:59:53 PM            Results for orders placed during the hospital encounter of 10/14/13  CBC      Result Value Range   WBC 8.3  4.0 - 10.5 K/uL   RBC 4.45  3.87 - 5.11 MIL/uL   Hemoglobin 14.0  12.0 - 15.0 g/dL   HCT 16.1  09.6 - 04.5 %   MCV 91.5  78.0 - 100.0 fL   MCH 31.5  26.0 - 34.0 pg   MCHC 34.4  30.0 - 36.0 g/dL   RDW 40.9  81.1 - 91.4 %   Platelets 226  150 - 400 K/uL  BASIC METABOLIC PANEL      Result Value Range   Sodium 144  135 - 145 mEq/L   Potassium 3.8  3.5 - 5.1 mEq/L   Chloride 107  96 - 112 mEq/L   CO2 28  19 - 32 mEq/L   Glucose, Bld 106 (*) 70 - 99 mg/dL   BUN 16  6 - 23 mg/dL   Creatinine, Ser 7.82 (*) 0.50 - 1.10 mg/dL   Calcium 9.5  8.4 - 95.6 mg/dL   GFR calc non Af Amer 47 (*) >90 mL/min   GFR calc Af Amer 54 (*) >90 mL/min  POCT I-STAT TROPONIN I      Result Value Range   Troponin i, poc 0.01  0.00 - 0.08 ng/mL   Comment 3            Results for orders placed during the hospital encounter of 10/14/13  CBC      Result Value Range   WBC 8.3  4.0 - 10.5 K/uL   RBC 4.45  3.87 - 5.11 MIL/uL   Hemoglobin 14.0  12.0 - 15.0 g/dL   HCT 21.3  08.6 - 57.8 %   MCV 91.5  78.0 - 100.0 fL   MCH 31.5  26.0 - 34.0 pg   MCHC 34.4  30.0 - 36.0 g/dL   RDW 46.9  62.9 - 52.8 %   Platelets 226  150 - 400 K/uL   BASIC METABOLIC PANEL      Result Value Range   Sodium 144  135 - 145 mEq/L   Potassium 3.8  3.5 - 5.1 mEq/L   Chloride 107  96 - 112 mEq/L   CO2 28  19 - 32 mEq/L   Glucose, Bld 106 (*) 70 - 99 mg/dL   BUN 16  6 - 23 mg/dL   Creatinine, Ser 4.13 (*) 0.50 - 1.10 mg/dL   Calcium 9.5  8.4 - 24.4 mg/dL   GFR calc non Af Amer 47 (*) >90 mL/min   GFR calc Af Amer 54 (*) >90 mL/min  POCT I-STAT TROPONIN I      Result Value Range   Troponin i, poc 0.01  0.00 - 0.08 ng/mL   Comment 3  Dg Chest 2 View  10/14/2013   CLINICAL DATA:  Hypertension and tachycardia. History of coronary artery disease with recent percutaneous coronary intervention.  EXAM: CHEST - 2 VIEW  COMPARISON:  09/04/2013  FINDINGS: The heart size is stable. At least one coronary stent is visible overlying the left heart. There is no evidence of pulmonary edema, airspace consolidation, pleural fluid or pneumothorax. The bony thorax shows mild degenerative changes of the thoracic spine.  IMPRESSION: No acute findings in the chest.   Electronically Signed   By: Irish Lack M.D.   On: 10/14/2013 17:32    Patient seen by me. Patient came in after advice from her cardiologist for high blood pressure she's also had some intermittent twinging of the chest nothing lasting 15 or 20 minutes. Patient's blood pressure is been fairly consistent for the last 3 days around 160/98. The patient not having any prolonged chest pain no shortness of breath no strokelike symptoms that are acute. Will try to discuss with her cardiologist to see if he wants to make any adjustments on her blood pressure medicine if we're unable to get a hold of him patient will need to call the office on the 26th.   Patient knows to return for any prolonged chest pain or any newer worse symptoms.  Shelda Jakes, MD 10/14/13 [phone]

## 2013-10-14 NOTE — ED Notes (Signed)
Dr. Deretha Emory at bedside.   Pt sts she has had intermittent "pinching" on left chest. Denies CP at present time.

## 2013-10-14 NOTE — ED Provider Notes (Signed)
CSN: [id number]     Arrival date & time 10/14/13  1618 History   First MD Initiated Contact with Patient 10/14/13 1651     Chief Complaint  Patient presents with  . Hypertension   (Consider location/radiation/quality/duration/timing/severity/associated sxs/prior Treatment) HPI Comments: Patient presents to the emergency department with chief complaint of elevated blood pressure. Additionally, patient states that she has "a funny sensation to the left side of my chest and across my low back." She states that she called her cardiologist, who told her to come to the emergency department. Approximately one month ago, she had a heart catheterization, as well as a stent placed by Dr. Sharyn Lull. She has followed by Dr. Algie Coffer. She states that for the past couple days she has had the funny sensation to her chest. She denies any shortness of breath, or diaphoresis. States that she is anticoagulated with eloquis. She also states that last night she had an episode of right lower extremity numbness. She states that she has had problems with her sciatic nerve in the past, but is unable to remember if this felt similar to that or not. She states the symptoms have since resolved. She denies any current chest pain. No other complaints at this time.  The history is provided by the patient. No language interpreter was used.    Past Medical History  Diagnosis Date  . Hypertension   . Atrial fibrillation    History reviewed. No pertinent past surgical history. History reviewed. No pertinent family history. History  Substance Use Topics  . Smoking status: Never Smoker   . Smokeless tobacco: Not on file  . Alcohol Use: No   OB History   Grav Para Term Preterm Abortions TAB SAB Ect Mult Living                 Review of Systems  All other systems reviewed and are negative.    Allergies  Codeine  Home Medications   Current Outpatient Rx  Name  Route  Sig  Dispense  Refill  . ALPRAZolam (XANAX) 0.25  MG tablet   Oral   Take 1 tablet (0.25 mg total) by mouth at bedtime as needed for anxiety.   30 tablet   0   . amiodarone (PACERONE) 200 MG tablet   Oral   Take 1 tablet (200 mg total) by mouth daily.   30 tablet   1   . apixaban (ELIQUIS) 2.5 MG TABS tablet   Oral   Take 1 tablet (2.5 mg total) by mouth 2 (two) times daily.   60 tablet   0   . aspirin 81 MG tablet   Oral   Take 1 tablet (81 mg total) by mouth daily.   30 tablet      . atorvastatin (LIPITOR) 80 MG tablet   Oral   Take 1 tablet (80 mg total) by mouth daily at 6 PM.   30 tablet   1   . calcium-vitamin D (OSCAL WITH D) 500-200 MG-UNIT per tablet   Oral   Take 1 tablet by mouth daily.         . carvedilol (COREG) 3.125 MG tablet   Oral   Take 3.125 mg by mouth 2 (two) times daily with a meal.         . clopidogrel (PLAVIX) 75 MG tablet   Oral   Take 1 tablet (75 mg total) by mouth daily with breakfast.   30 tablet   1   . hydrochlorothiazide (  HYDRODIURIL) 12.5 MG tablet   Oral   Take 12.5 mg by mouth daily.         Marland Kitchen lisinopril (PRINIVIL,ZESTRIL) 20 MG tablet   Oral   Take 10 mg by mouth daily.         . Multiple Vitamins-Minerals (MULTIVITAMIN WITH MINERALS) tablet   Oral   Take 1 tablet by mouth daily.         . nitroGLYCERIN (NITROSTAT) 0.4 MG SL tablet   Sublingual   Place 1 tablet (0.4 mg total) under the tongue every 5 (five) minutes x 3 doses as needed for chest pain.   25 tablet   1   . potassium chloride (K-DUR) 10 MEQ tablet   Oral   Take 1 tablet (10 mEq total) by mouth daily.   30 tablet   1    BP 162/98  Pulse 75  Temp(Src) 98 F (36.7 C) (Oral)  SpO2 100% Physical Exam  Nursing note and vitals reviewed. Constitutional: She is oriented to person, place, and time. She appears well-developed and well-nourished.  HENT:  Head: Normocephalic and atraumatic.  Eyes: Conjunctivae and EOM are normal. Pupils are equal, round, and reactive to light.  Neck:  Normal range of motion. Neck supple.  Cardiovascular: Normal rate and regular rhythm.  Exam reveals no gallop and no friction rub.   No murmur heard. Pulmonary/Chest: Effort normal and breath sounds normal. No respiratory distress. She has no wheezes. She has no rales. She exhibits no tenderness.  Abdominal: Soft. Bowel sounds are normal. She exhibits no distension and no mass. There is no tenderness. There is no rebound and no guarding.  Musculoskeletal: Normal range of motion. She exhibits no edema and no tenderness.  Neurological: She is alert and oriented to person, place, and time.  Skin: Skin is warm and dry.  Psychiatric: She has a normal mood and affect. Her behavior is normal. Judgment and thought content normal.    ED Course  Procedures (including critical care time) Results for orders placed during the hospital encounter of 10/14/13  CBC      Result Value Range   WBC 8.3  4.0 - 10.5 K/uL   RBC 4.45  3.87 - 5.11 MIL/uL   Hemoglobin 14.0  12.0 - 15.0 g/dL   HCT 16.1  09.6 - 04.5 %   MCV 91.5  78.0 - 100.0 fL   MCH 31.5  26.0 - 34.0 pg   MCHC 34.4  30.0 - 36.0 g/dL   RDW 40.9  81.1 - 91.4 %   Platelets 226  150 - 400 K/uL  BASIC METABOLIC PANEL      Result Value Range   Sodium 144  135 - 145 mEq/L   Potassium 3.8  3.5 - 5.1 mEq/L   Chloride 107  96 - 112 mEq/L   CO2 28  19 - 32 mEq/L   Glucose, Bld 106 (*) 70 - 99 mg/dL   BUN 16  6 - 23 mg/dL   Creatinine, Ser 7.82 (*) 0.50 - 1.10 mg/dL   Calcium 9.5  8.4 - 95.6 mg/dL   GFR calc non Af Amer 47 (*) >90 mL/min   GFR calc Af Amer 54 (*) >90 mL/min  POCT I-STAT TROPONIN I      Result Value Range   Troponin i, poc 0.01  0.00 - 0.08 ng/mL   Comment 3            Dg Chest 2 View  10/14/2013   CLINICAL  DATA:  Hypertension and tachycardia. History of coronary artery disease with recent percutaneous coronary intervention.  EXAM: CHEST - 2 VIEW  COMPARISON:  09/04/2013  FINDINGS: The heart size is stable. At least one coronary  stent is visible overlying the left heart. There is no evidence of pulmonary edema, airspace consolidation, pleural fluid or pneumothorax. The bony thorax shows mild degenerative changes of the thoracic spine.  IMPRESSION: No acute findings in the chest.   Electronically Signed   By: Irish Lack M.D.   On: 10/14/2013 17:32    Imaging Review No results found.  EKG Interpretation    Date/Time:  Wednesday October 14 2013 16:44:43 EST Ventricular Rate:  107 PR Interval:    QRS Duration: 86 QT Interval:  346 QTC Calculation: 461 R Axis:   107 Text Interpretation:  Atrial fibrillation with rapid ventricular response Rightward axis Low voltage QRS ST \\T \ T wave abnormality, consider inferolateral ischemia Abnormal ECG No significant change since last tracing Confirmed by ZACKOWSKI  MD, SCOTT ([id number]) on 10/14/2013 4:59:53 PM            MDM   1. Hypertension     Patient with elevated BP and chest pain that has been intermittent for the past two days.  Patient states that the chest pain is like a "twinge" in the muscle.  I doubt cardiac etiology at this time.  Initial labs are reassuring.  Patient has been seen by and discussed with Dr. Deretha Emory.  Patient discussed with Dr. Algie Coffer, who recommends giving amlodipine, and discharging on amlodipine if the BP comes down.    BP has lowered, and remains down.  Will discharge with amlodipine.  Discussed this with Dr. Deretha Emory, who agrees that the patient can be discharged to home.  Patient is stable and ready for discharge.   Roxy Horseman, PA-C 10/15/13 [phone]

## 2013-10-14 NOTE — ED Notes (Signed)
Pt reports taking her bp meds as prescribed by sbp has been 160-180.  Also reports "funny sensation to left side of chest and across her lower back." no acute disterss noted.

## 2014-03-18 ENCOUNTER — Emergency Department (HOSPITAL_COMMUNITY)
Admission: EM | Admit: 2014-03-18 | Discharge: 2014-03-18 | Disposition: A | Discharge status: Home / Self Care | Payer: Medicare HMO | Attending: Emergency Medicine | Admitting: Emergency Medicine

## 2014-03-18 ENCOUNTER — Emergency Department (HOSPITAL_COMMUNITY): Payer: Medicare HMO

## 2014-03-18 ENCOUNTER — Encounter (HOSPITAL_COMMUNITY): Payer: Self-pay | Admitting: Emergency Medicine

## 2014-03-18 DIAGNOSIS — Z7902 Long term (current) use of antithrombotics/antiplatelets: Secondary | ICD-10-CM | POA: Insufficient documentation

## 2014-03-18 DIAGNOSIS — Z79899 Other long term (current) drug therapy: Secondary | ICD-10-CM | POA: Insufficient documentation

## 2014-03-18 DIAGNOSIS — I4891 Unspecified atrial fibrillation: Secondary | ICD-10-CM | POA: Insufficient documentation

## 2014-03-18 DIAGNOSIS — I2510 Atherosclerotic heart disease of native coronary artery without angina pectoris: Secondary | ICD-10-CM | POA: Insufficient documentation

## 2014-03-18 DIAGNOSIS — M79609 Pain in unspecified limb: Secondary | ICD-10-CM | POA: Insufficient documentation

## 2014-03-18 DIAGNOSIS — I10 Essential (primary) hypertension: Secondary | ICD-10-CM | POA: Insufficient documentation

## 2014-03-18 DIAGNOSIS — M79601 Pain in right arm: Secondary | ICD-10-CM

## 2014-03-18 LAB — TROPONIN I: Troponin I: <0.3 ng/mL (ref <0.30)

## 2014-03-18 MED ORDER — LORAZEPAM 1 MG PO TABS
0.5000 mg | ORAL_TABLET | Freq: Once | ORAL | Status: AC
  Administered 2014-03-18: 0.5 mg via ORAL
  Filled 2014-03-18: qty 1

## 2014-03-18 MED ORDER — GABAPENTIN 100 MG PO CAPS: 100.0000 mg | ORAL_CAPSULE | Freq: Three times a day (TID) | ORAL | Status: AC

## 2014-03-18 NOTE — ED Notes (Addendum)
Pt c/o right arm pain since November 2014. 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 sts previous sx to this location. Reports a burning sensation around old surgical site that is now starting to radiate into other parts of her arm. Pt also sts she has been having some pain in right lower back. sts she has been to her PCP for these problems before but feels like nothing is being done. sts the burning sensation is worse at night while she is trying to sleep and didn't get any sleep last night because of it. Nad, skin warm and dry, resp e/u.

## 2014-03-18 NOTE — ED Notes (Signed)
Phlebotomy at bedside.
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
## 2014-03-18 NOTE — ED Notes (Signed)
Registration at bedside.

## 2014-03-18 NOTE — ED Notes (Signed)
Pt returned from radiology.

## 2014-03-20 NOTE — ED Provider Notes (Signed)
I saw and evaluated the patient, reviewed the resident's note and I agree with the findings and plan.   EKG Interpretation None      68yF with R arm pain. No trauma. Atypical for ACS. Seems almost neuropathic in nature. Nonfocal neuro exam. W/u pretty unremarkable.   Virgel Manifold, MD 03/20/14 [phone]

## 2014-06-11 ENCOUNTER — Other Ambulatory Visit (HOSPITAL_COMMUNITY): Payer: Self-pay | Admitting: Cardiology

## 2014-06-11 DIAGNOSIS — R079 Chest pain, unspecified: Secondary | ICD-10-CM

## 2014-06-18 ENCOUNTER — Encounter (HOSPITAL_COMMUNITY)
Admission: RE | Admit: 2014-06-18 | Discharge: 2014-06-18 | Disposition: A | Discharge status: Home / Self Care | Payer: Medicare HMO | Source: Ambulatory Visit | Attending: Cardiology | Admitting: Cardiology

## 2014-06-18 DIAGNOSIS — R079 Chest pain, unspecified: Secondary | ICD-10-CM | POA: Diagnosis not present

## 2014-06-18 MED ORDER — TECHNETIUM TC 99M SESTAMIBI GENERIC - CARDIOLITE
10.0000 | Freq: Once | INTRAVENOUS | Status: AC | PRN
  Administered 2014-06-18: 10 via INTRAVENOUS

## 2014-06-18 MED ORDER — REGADENOSON 0.4 MG/5ML IV SOLN
0.4000 mg | Freq: Once | INTRAVENOUS | Status: AC
  Administered 2014-06-18: 0.4 mg via INTRAVENOUS

## 2014-06-18 MED ORDER — REGADENOSON 0.4 MG/5ML IV SOLN
INTRAVENOUS | Status: AC
  Administered 2014-06-18: 0.4 mg via INTRAVENOUS
  Filled 2014-06-18: qty 5

## 2014-06-18 MED ORDER — TECHNETIUM TC 99M SESTAMIBI GENERIC - CARDIOLITE
30.0000 | Freq: Once | INTRAVENOUS | Status: AC | PRN
  Administered 2014-06-18: 30 via INTRAVENOUS

## 2014-09-30 ENCOUNTER — Encounter (HOSPITAL_COMMUNITY): Payer: Self-pay | Admitting: Cardiology

## 2014-11-09 IMAGING — CR DG HUMERUS 2V *R*
2 series · 2 of 2 positions shown · non-contrast
Comparison: None.

CLINICAL DATA: Burning sensation in the right upper arm.

EXAM:
RIGHT HUMERUS - 2+ VIEW

[w humerus ap right]
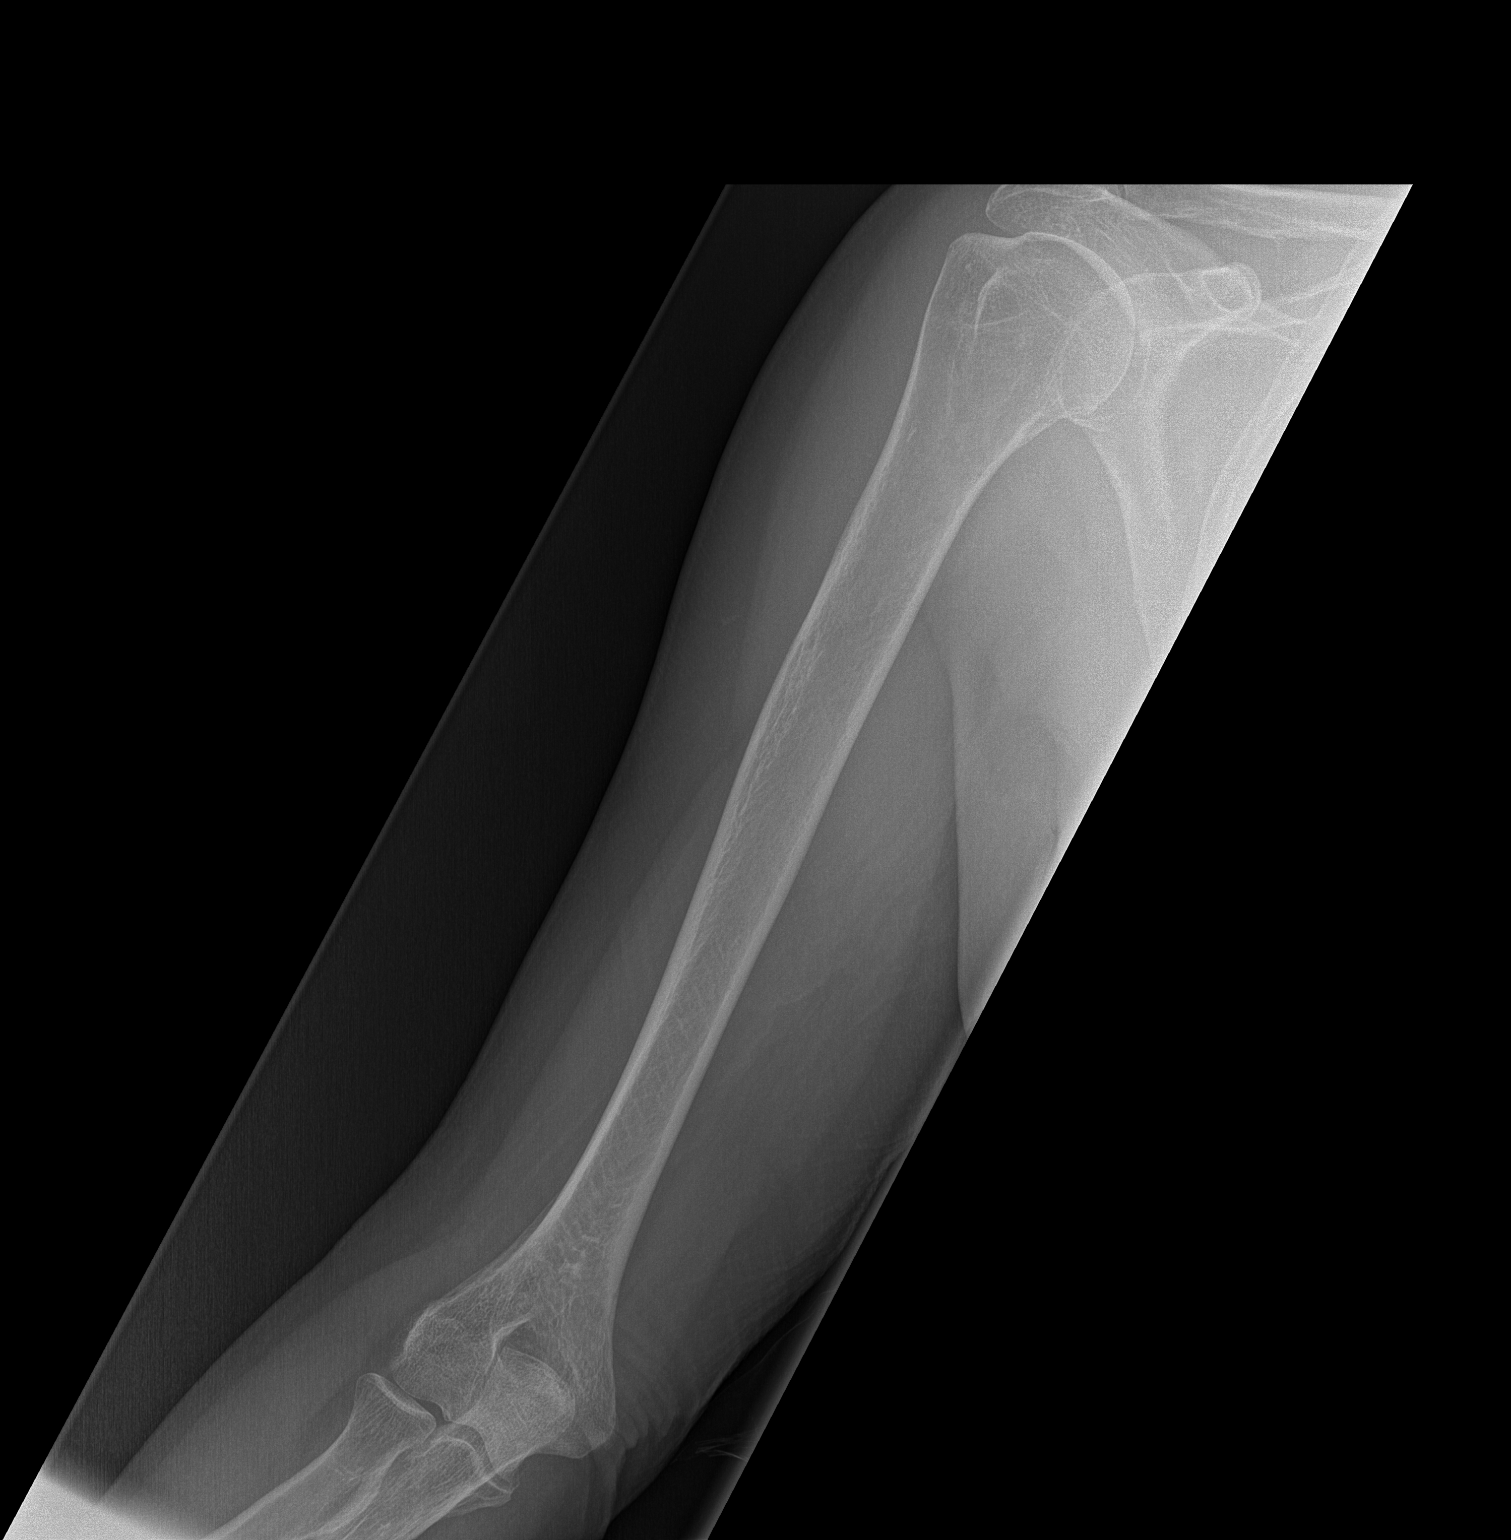

[w humerus lat right]
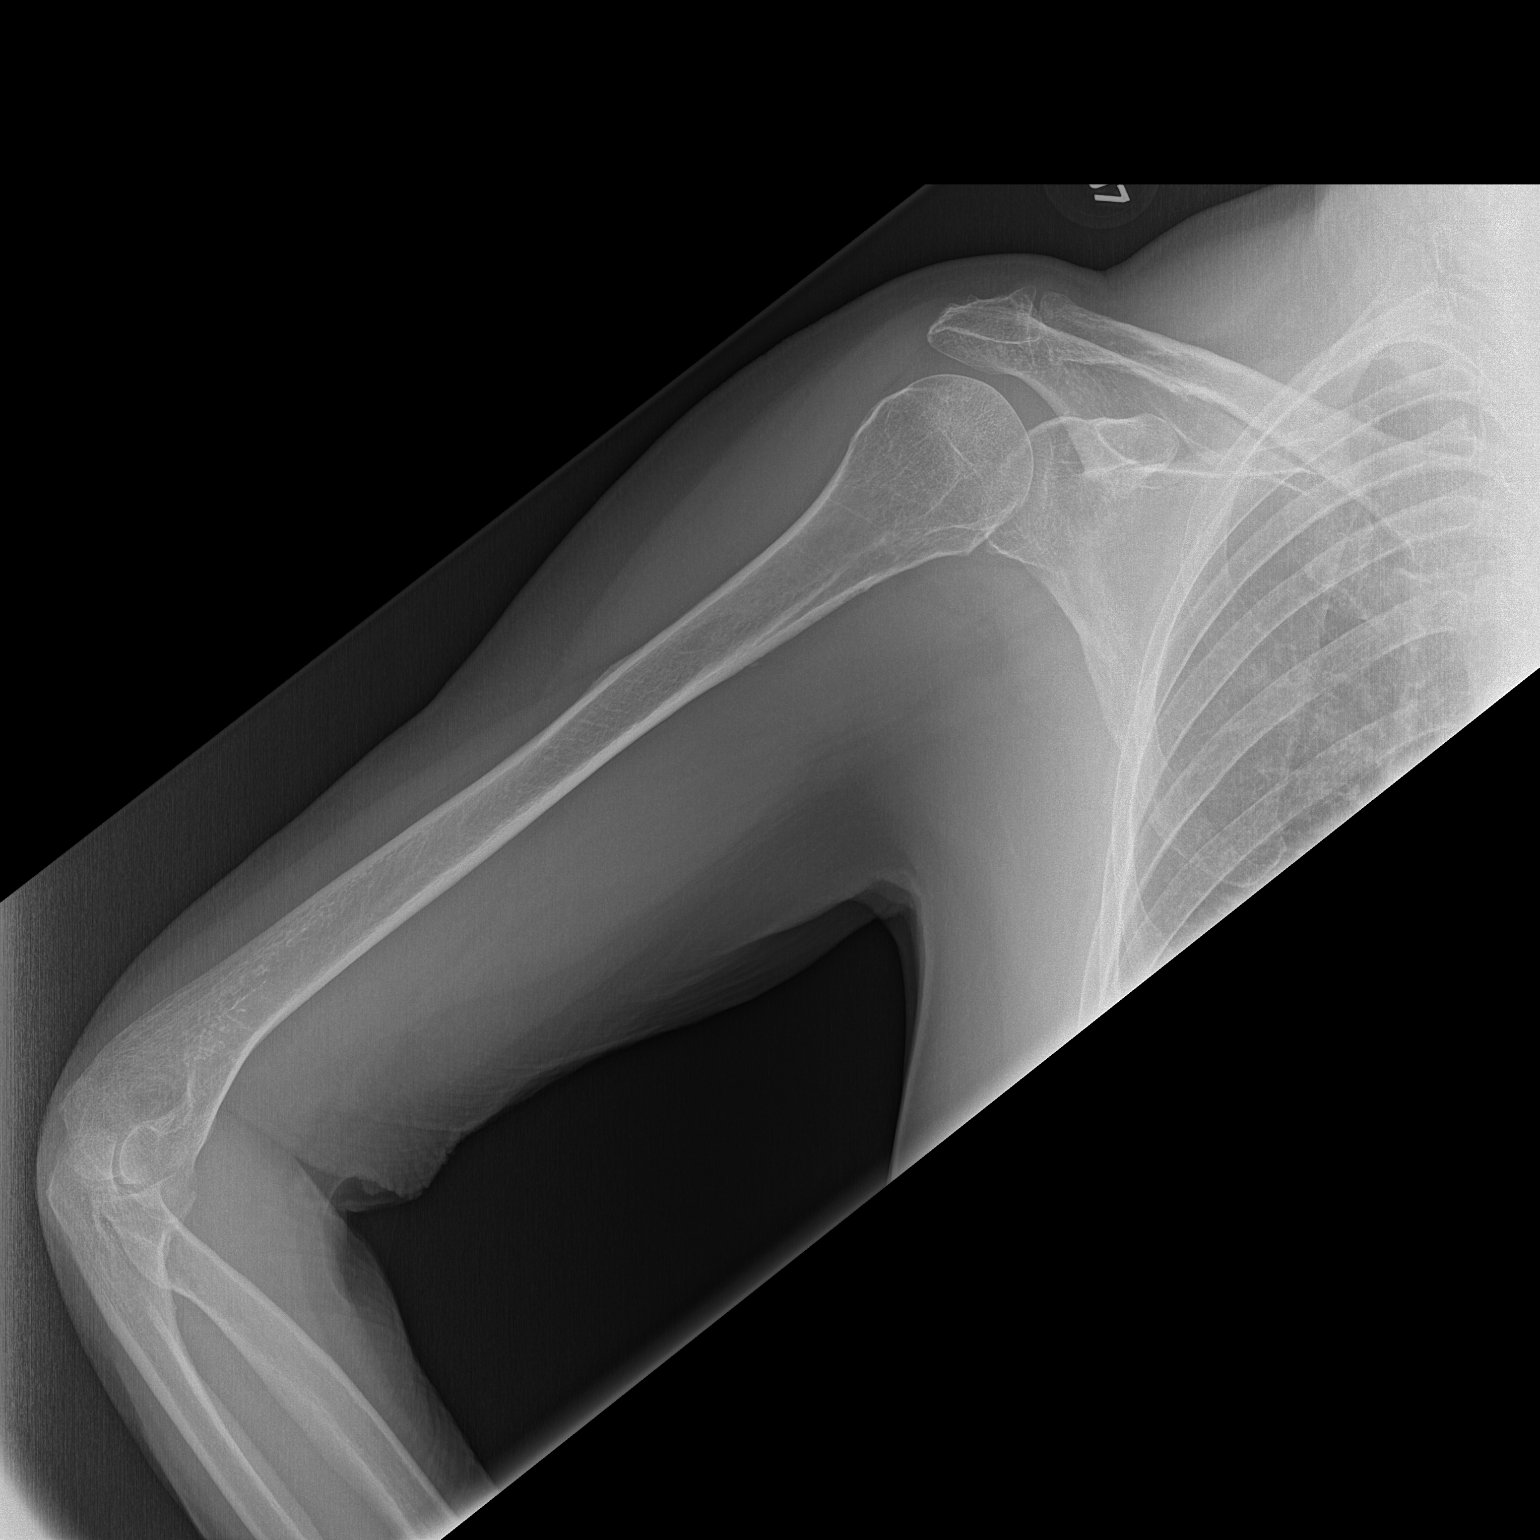

[2 of 2 positions shown; findings below may reference images not displayed]

FINDINGS: There is no acute or focal bony abnormality. Acromioclavicular
degenerative disease is noted. Soft tissue structures appear normal.
Image right lung and ribs are unremarkable.
IMPRESSION: No acute abnormality or finding to explain the patient's symptoms.

Acromioclavicular osteoarthritis.

## 2015-11-03 ENCOUNTER — Other Ambulatory Visit: Payer: Self-pay | Admitting: Cardiology

## 2015-11-03 DIAGNOSIS — R079 Chest pain, unspecified: Secondary | ICD-10-CM

## 2015-11-09 ENCOUNTER — Encounter (HOSPITAL_COMMUNITY)
Admission: RE | Admit: 2015-11-09 | Discharge: 2015-11-09 | Disposition: A | Discharge status: Home / Self Care | Payer: Medicare HMO | Source: Ambulatory Visit | Attending: Cardiology | Admitting: Cardiology

## 2015-11-09 DIAGNOSIS — I10 Essential (primary) hypertension: Secondary | ICD-10-CM | POA: Insufficient documentation

## 2015-11-09 DIAGNOSIS — R079 Chest pain, unspecified: Secondary | ICD-10-CM | POA: Diagnosis present

## 2015-11-09 DIAGNOSIS — I2510 Atherosclerotic heart disease of native coronary artery without angina pectoris: Secondary | ICD-10-CM | POA: Diagnosis not present

## 2015-11-09 DIAGNOSIS — I5189 Other ill-defined heart diseases: Secondary | ICD-10-CM | POA: Insufficient documentation

## 2015-11-09 LAB — HEPATIC FUNCTION PANEL
ALBUMIN: 3.8 g/dL (ref 3.5–5.0)
ALT: 11 U/L — AB (ref 14–54)
AST: 31 U/L (ref 15–41)
Alkaline Phosphatase: 78 U/L (ref 38–126)
BILIRUBIN DIRECT: 0.2 mg/dL (ref 0.1–0.5)
Indirect Bilirubin: 0.7 mg/dL (ref 0.3–0.9)
Total Bilirubin: 0.9 mg/dL (ref 0.3–1.2)
Total Protein: 6.9 g/dL (ref 6.5–8.1)

## 2015-11-09 LAB — BASIC METABOLIC PANEL
ANION GAP: 7 (ref 5–15)
BUN: 26 mg/dL — ABNORMAL HIGH (ref 6–20)
CO2: 29 mmol/L (ref 22–32)
Calcium: 9.4 mg/dL (ref 8.9–10.3)
Chloride: 107 mmol/L (ref 101–111)
Creatinine, Ser: 1.22 mg/dL — ABNORMAL HIGH (ref 0.44–1.00)
GFR calc Af Amer: 50 mL/min — ABNORMAL LOW (ref >60)
GFR, EST NON AFRICAN AMERICAN: 43 mL/min — AB (ref >60)
GLUCOSE: 118 mg/dL — AB (ref 65–99)
POTASSIUM: 4.5 mmol/L (ref 3.5–5.1)
Sodium: 143 mmol/L (ref 135–145)

## 2015-11-09 LAB — TSH: TSH: 4.95 u[IU]/mL — AB (ref 0.350–4.500)

## 2015-11-09 LAB — LIPID PANEL
Cholesterol: 130 mg/dL (ref 0–200)
HDL: 59 mg/dL (ref >40)
LDL Cholesterol: 55 mg/dL (ref 0–99)
Total CHOL/HDL Ratio: 2.2 RATIO
Triglycerides: 80 mg/dL (ref <150)
VLDL: 16 mg/dL (ref 0–40)

## 2015-11-09 MED ORDER — REGADENOSON 0.4 MG/5ML IV SOLN
0.4000 mg | Freq: Once | INTRAVENOUS | Status: AC
  Administered 2015-11-09: 0.4 mg via INTRAVENOUS

## 2015-11-09 MED ORDER — TECHNETIUM TC 99M SESTAMIBI - CARDIOLITE
30.0000 | Freq: Once | INTRAVENOUS | Status: AC | PRN
  Administered 2015-11-09: 30 via INTRAVENOUS

## 2015-11-09 MED ORDER — REGADENOSON 0.4 MG/5ML IV SOLN
INTRAVENOUS | Status: AC
  Filled 2015-11-09: qty 5

## 2015-11-09 MED ORDER — REGADENOSON 0.4 MG/5ML IV SOLN
INTRAVENOUS | Status: AC
  Administered 2015-11-09: 0.4 mg via INTRAVENOUS
  Filled 2015-11-09: qty 5

## 2015-11-09 MED ORDER — TECHNETIUM TC 99M SESTAMIBI GENERIC - CARDIOLITE
10.0000 | Freq: Once | INTRAVENOUS | Status: AC | PRN
  Administered 2015-11-09: 10 via INTRAVENOUS

## 2015-11-10 LAB — NM MYOCAR MULTI W/SPECT W/WALL MOTION / EF
CHL CUP STRESS STAGE 1 SBP: 175 mmHg
CHL CUP STRESS STAGE 1 SPEED: 0 mph
CHL CUP STRESS STAGE 2 GRADE: 0 %
CHL CUP STRESS STAGE 2 SPEED: 0 mph
CHL CUP STRESS STAGE 3 DBP: 85 mmHg
CHL CUP STRESS STAGE 3 GRADE: 0 %
CHL CUP STRESS STAGE 3 SBP: 139 mmHg
CHL CUP STRESS STAGE 3 SPEED: 0 mph
CHL CUP STRESS STAGE 4 DBP: 87 mmHg
CHL CUP STRESS STAGE 4 HR: 90 {beats}/min
CHL CUP STRESS STAGE 4 SBP: 143 mmHg
CHL CUP STRESS STAGE 4 SPEED: 0 mph
CHL CUP STRESS STAGE 5 DBP: 88 mmHg
CHL CUP STRESS STAGE 5 GRADE: 0 %
CHL CUP STRESS STAGE 5 SBP: 148 mmHg
CSEPPBP: 148 mmHg
CSEPPHR: 100 {beats}/min
CSEPPMHR: 67 %
Estimated workload: 1 METS
Stage 1 DBP: 97 mmHg
Stage 1 Grade: 0 %
Stage 1 HR: 86 {beats}/min
Stage 2 HR: 84 {beats}/min
Stage 3 HR: 92 {beats}/min
Stage 4 Grade: 0 %
Stage 5 HR: 100 {beats}/min
Stage 5 Speed: 0 mph

## 2016-01-21 DEATH — deceased
